# Patient Record
Sex: Male | Born: 1950 | Race: Black or African American | Hispanic: No | Smoking: Current every day smoker
Health system: Southern US, Community
[De-identification: ages and names within clinical notes are randomized; demographics above are authoritative.]

## PROBLEM LIST (undated history)

## (undated) DIAGNOSIS — Z720 Tobacco use: Secondary | ICD-10-CM

## (undated) DIAGNOSIS — F1010 Alcohol abuse, uncomplicated: Secondary | ICD-10-CM

---

## 2017-05-11 ENCOUNTER — Encounter: Payer: Self-pay | Admitting: Emergency Medicine

## 2017-05-11 ENCOUNTER — Inpatient Hospital Stay
Admission: EM | Admit: 2017-05-11 | Discharge: 2017-06-06 | DRG: 208 | Disposition: E | Payer: Medicare Other | Attending: Internal Medicine | Admitting: Internal Medicine

## 2017-05-11 ENCOUNTER — Other Ambulatory Visit: Payer: Self-pay

## 2017-05-11 ENCOUNTER — Emergency Department: Payer: Medicare Other

## 2017-05-11 DIAGNOSIS — I2489 Other forms of acute ischemic heart disease: Secondary | ICD-10-CM

## 2017-05-11 DIAGNOSIS — I248 Other forms of acute ischemic heart disease: Secondary | ICD-10-CM

## 2017-05-11 DIAGNOSIS — Z515 Encounter for palliative care: Secondary | ICD-10-CM | POA: Diagnosis not present

## 2017-05-11 DIAGNOSIS — F17290 Nicotine dependence, other tobacco product, uncomplicated: Secondary | ICD-10-CM | POA: Diagnosis present

## 2017-05-11 DIAGNOSIS — J9600 Acute respiratory failure, unspecified whether with hypoxia or hypercapnia: Secondary | ICD-10-CM

## 2017-05-11 DIAGNOSIS — R579 Shock, unspecified: Secondary | ICD-10-CM | POA: Diagnosis not present

## 2017-05-11 DIAGNOSIS — F1010 Alcohol abuse, uncomplicated: Secondary | ICD-10-CM | POA: Diagnosis present

## 2017-05-11 DIAGNOSIS — K7031 Alcoholic cirrhosis of liver with ascites: Secondary | ICD-10-CM | POA: Diagnosis not present

## 2017-05-11 DIAGNOSIS — R34 Anuria and oliguria: Secondary | ICD-10-CM | POA: Diagnosis not present

## 2017-05-11 DIAGNOSIS — R188 Other ascites: Secondary | ICD-10-CM | POA: Diagnosis present

## 2017-05-11 DIAGNOSIS — I4892 Unspecified atrial flutter: Secondary | ICD-10-CM

## 2017-05-11 DIAGNOSIS — K7290 Hepatic failure, unspecified without coma: Secondary | ICD-10-CM | POA: Diagnosis present

## 2017-05-11 DIAGNOSIS — R570 Cardiogenic shock: Secondary | ICD-10-CM | POA: Diagnosis not present

## 2017-05-11 DIAGNOSIS — I469 Cardiac arrest, cause unspecified: Secondary | ICD-10-CM | POA: Diagnosis not present

## 2017-05-11 DIAGNOSIS — R001 Bradycardia, unspecified: Secondary | ICD-10-CM | POA: Diagnosis not present

## 2017-05-11 DIAGNOSIS — K7469 Other cirrhosis of liver: Secondary | ICD-10-CM | POA: Diagnosis not present

## 2017-05-11 DIAGNOSIS — I5021 Acute systolic (congestive) heart failure: Secondary | ICD-10-CM | POA: Diagnosis present

## 2017-05-11 DIAGNOSIS — D6959 Other secondary thrombocytopenia: Secondary | ICD-10-CM | POA: Diagnosis present

## 2017-05-11 DIAGNOSIS — Z880 Allergy status to penicillin: Secondary | ICD-10-CM | POA: Diagnosis not present

## 2017-05-11 DIAGNOSIS — E871 Hypo-osmolality and hyponatremia: Secondary | ICD-10-CM | POA: Diagnosis present

## 2017-05-11 DIAGNOSIS — I426 Alcoholic cardiomyopathy: Secondary | ICD-10-CM | POA: Diagnosis present

## 2017-05-11 DIAGNOSIS — Z0189 Encounter for other specified special examinations: Secondary | ICD-10-CM

## 2017-05-11 DIAGNOSIS — K7460 Unspecified cirrhosis of liver: Secondary | ICD-10-CM | POA: Diagnosis present

## 2017-05-11 DIAGNOSIS — I4891 Unspecified atrial fibrillation: Secondary | ICD-10-CM | POA: Diagnosis present

## 2017-05-11 DIAGNOSIS — R0602 Shortness of breath: Secondary | ICD-10-CM | POA: Diagnosis present

## 2017-05-11 DIAGNOSIS — Z66 Do not resuscitate: Secondary | ICD-10-CM | POA: Diagnosis not present

## 2017-05-11 DIAGNOSIS — I2510 Atherosclerotic heart disease of native coronary artery without angina pectoris: Secondary | ICD-10-CM | POA: Diagnosis present

## 2017-05-11 DIAGNOSIS — Z978 Presence of other specified devices: Secondary | ICD-10-CM

## 2017-05-11 DIAGNOSIS — N179 Acute kidney failure, unspecified: Secondary | ICD-10-CM | POA: Diagnosis present

## 2017-05-11 DIAGNOSIS — Z01818 Encounter for other preprocedural examination: Secondary | ICD-10-CM

## 2017-05-11 DIAGNOSIS — D696 Thrombocytopenia, unspecified: Secondary | ICD-10-CM | POA: Diagnosis not present

## 2017-05-11 DIAGNOSIS — J9690 Respiratory failure, unspecified, unspecified whether with hypoxia or hypercapnia: Secondary | ICD-10-CM

## 2017-05-11 DIAGNOSIS — I2699 Other pulmonary embolism without acute cor pulmonale: Principal | ICD-10-CM

## 2017-05-11 DIAGNOSIS — Z452 Encounter for adjustment and management of vascular access device: Secondary | ICD-10-CM

## 2017-05-11 DIAGNOSIS — K769 Liver disease, unspecified: Secondary | ICD-10-CM | POA: Diagnosis not present

## 2017-05-11 DIAGNOSIS — J9601 Acute respiratory failure with hypoxia: Secondary | ICD-10-CM | POA: Diagnosis present

## 2017-05-11 DIAGNOSIS — I420 Dilated cardiomyopathy: Secondary | ICD-10-CM | POA: Diagnosis not present

## 2017-05-11 HISTORY — DX: Alcohol abuse, uncomplicated: F10.10

## 2017-05-11 HISTORY — DX: Tobacco use: Z72.0

## 2017-05-11 LAB — CBC
HCT: 47.9 % (ref 40.0–52.0)
Hemoglobin: 15.9 g/dL (ref 13.0–18.0)
MCH: 32 pg (ref 26.0–34.0)
MCHC: 33.1 g/dL (ref 32.0–36.0)
MCV: 96.6 fL (ref 80.0–100.0)
PLATELETS: 99 10*3/uL — AB (ref 150–440)
RBC: 4.96 MIL/uL (ref 4.40–5.90)
RDW: 13.9 % (ref 11.5–14.5)
WBC: 7.9 10*3/uL (ref 3.8–10.6)

## 2017-05-11 LAB — HEPATIC FUNCTION PANEL
ALBUMIN: 3.8 g/dL (ref 3.5–5.0)
ALT: 352 U/L — AB (ref 17–63)
AST: 451 U/L — AB (ref 15–41)
Alkaline Phosphatase: 64 U/L (ref 38–126)
BILIRUBIN DIRECT: 1.5 mg/dL — AB (ref 0.1–0.5)
BILIRUBIN TOTAL: 3.9 mg/dL — AB (ref 0.3–1.2)
Indirect Bilirubin: 2.4 mg/dL — ABNORMAL HIGH (ref 0.3–0.9)
Total Protein: 7.9 g/dL (ref 6.5–8.1)

## 2017-05-11 LAB — BASIC METABOLIC PANEL
Anion gap: 12 (ref 5–15)
BUN: 35 mg/dL — AB (ref 6–20)
CALCIUM: 9.1 mg/dL (ref 8.9–10.3)
CO2: 21 mmol/L — ABNORMAL LOW (ref 22–32)
Chloride: 99 mmol/L — ABNORMAL LOW (ref 101–111)
Creatinine, Ser: 1.56 mg/dL — ABNORMAL HIGH (ref 0.61–1.24)
GFR calc Af Amer: 51 mL/min — ABNORMAL LOW (ref >60)
GFR, EST NON AFRICAN AMERICAN: 44 mL/min — AB (ref >60)
GLUCOSE: 180 mg/dL — AB (ref 65–99)
Potassium: 4.4 mmol/L (ref 3.5–5.1)
Sodium: 132 mmol/L — ABNORMAL LOW (ref 135–145)

## 2017-05-11 LAB — BRAIN NATRIURETIC PEPTIDE: B Natriuretic Peptide: 1162 pg/mL — ABNORMAL HIGH (ref 0.0–100.0)

## 2017-05-11 LAB — MAGNESIUM: MAGNESIUM: 2 mg/dL (ref 1.7–2.4)

## 2017-05-11 LAB — TROPONIN I
TROPONIN I: 0.05 ng/mL — AB (ref <0.03)
TROPONIN I: 0.06 ng/mL — AB (ref <0.03)

## 2017-05-11 LAB — APTT: APTT: 37 s — AB (ref 24–36)

## 2017-05-11 MED ORDER — ASPIRIN EC 81 MG PO TBEC
81.0000 mg | DELAYED_RELEASE_TABLET | Freq: Every day | ORAL | Status: DC
  Administered 2017-05-12 – 2017-05-13 (×2): 81 mg via ORAL
  Filled 2017-05-11 (×2): qty 1

## 2017-05-11 MED ORDER — ENOXAPARIN SODIUM 100 MG/ML ~~LOC~~ SOLN
90.0000 mg | Freq: Two times a day (BID) | SUBCUTANEOUS | Status: DC
Start: 2017-05-12 — End: 2017-05-12
  Administered 2017-05-12: 90 mg via SUBCUTANEOUS
  Filled 2017-05-11 (×2): qty 1

## 2017-05-11 MED ORDER — LISINOPRIL 5 MG PO TABS: 2.5000 mg | ORAL_TABLET | Freq: Every day | ORAL | Status: DC

## 2017-05-11 MED ORDER — THIAMINE HCL 100 MG/ML IJ SOLN
100.0000 mg | Freq: Every day | INTRAMUSCULAR | Status: DC
  Administered 2017-05-14: 100 mg via INTRAVENOUS
  Filled 2017-05-11: qty 2

## 2017-05-11 MED ORDER — OXYCODONE HCL 5 MG PO TABS: 5.0000 mg | ORAL_TABLET | ORAL | Status: DC | PRN

## 2017-05-11 MED ORDER — DILTIAZEM HCL 25 MG/5ML IV SOLN
15.0000 mg | Freq: Once | INTRAVENOUS | Status: AC
  Administered 2017-05-11: 15 mg via INTRAVENOUS
  Filled 2017-05-11: qty 5

## 2017-05-11 MED ORDER — BISACODYL 5 MG PO TBEC: 5.0000 mg | DELAYED_RELEASE_TABLET | Freq: Every day | ORAL | Status: DC | PRN

## 2017-05-11 MED ORDER — FOLIC ACID 1 MG PO TABS
1.0000 mg | ORAL_TABLET | Freq: Every day | ORAL | Status: DC
  Administered 2017-05-12 – 2017-05-13 (×2): 1 mg via ORAL
  Filled 2017-05-11 (×2): qty 1

## 2017-05-11 MED ORDER — ONDANSETRON HCL 4 MG/2ML IJ SOLN: 4.0000 mg | Freq: Four times a day (QID) | INTRAMUSCULAR | Status: DC | PRN

## 2017-05-11 MED ORDER — SENNOSIDES-DOCUSATE SODIUM 8.6-50 MG PO TABS: 1.0000 | ORAL_TABLET | Freq: Every evening | ORAL | Status: DC | PRN

## 2017-05-11 MED ORDER — ONDANSETRON HCL 4 MG PO TABS: 4.0000 mg | ORAL_TABLET | Freq: Four times a day (QID) | ORAL | Status: DC | PRN

## 2017-05-11 MED ORDER — DILTIAZEM HCL 30 MG PO TABS
60.0000 mg | ORAL_TABLET | Freq: Three times a day (TID) | ORAL | Status: DC
  Administered 2017-05-11 – 2017-05-13 (×3): 60 mg via ORAL
  Filled 2017-05-11 (×5): qty 2

## 2017-05-11 MED ORDER — SODIUM CHLORIDE 0.9 % IV BOLUS (SEPSIS)
1000.0000 mL | Freq: Once | INTRAVENOUS | Status: AC
  Administered 2017-05-11: 1000 mL via INTRAVENOUS

## 2017-05-11 MED ORDER — ADULT MULTIVITAMIN W/MINERALS CH
1.0000 | ORAL_TABLET | Freq: Every day | ORAL | Status: DC
  Administered 2017-05-12 – 2017-05-13 (×2): 1 via ORAL
  Filled 2017-05-11 (×2): qty 1

## 2017-05-11 MED ORDER — FUROSEMIDE 10 MG/ML IJ SOLN
20.0000 mg | Freq: Two times a day (BID) | INTRAMUSCULAR | Status: DC
  Administered 2017-05-11 – 2017-05-12 (×2): 20 mg via INTRAVENOUS
  Filled 2017-05-11 (×3): qty 2

## 2017-05-11 MED ORDER — ACETAMINOPHEN 325 MG PO TABS: 650.0000 mg | ORAL_TABLET | Freq: Four times a day (QID) | ORAL | Status: DC | PRN

## 2017-05-11 MED ORDER — VITAMIN B-1 100 MG PO TABS
100.0000 mg | ORAL_TABLET | Freq: Every day | ORAL | Status: DC
  Administered 2017-05-12 – 2017-05-13 (×2): 100 mg via ORAL
  Filled 2017-05-11 (×2): qty 1

## 2017-05-11 MED ORDER — DILTIAZEM HCL 25 MG/5ML IV SOLN
10.0000 mg | INTRAVENOUS | Status: AC
  Administered 2017-05-11: 10 mg via INTRAVENOUS
  Filled 2017-05-11: qty 5

## 2017-05-11 MED ORDER — SODIUM CHLORIDE 0.9% FLUSH
3.0000 mL | Freq: Two times a day (BID) | INTRAVENOUS | Status: DC
  Administered 2017-05-11 – 2017-05-14 (×6): 3 mL via INTRAVENOUS

## 2017-05-11 MED ORDER — SODIUM CHLORIDE 0.9 % IV SOLN: 250.0000 mL | INTRAVENOUS | Status: DC | PRN

## 2017-05-11 MED ORDER — LORAZEPAM 1 MG PO TABS: 1.0000 mg | ORAL_TABLET | Freq: Four times a day (QID) | ORAL | Status: DC | PRN

## 2017-05-11 MED ORDER — ALBUTEROL SULFATE (2.5 MG/3ML) 0.083% IN NEBU: 2.5000 mg | INHALATION_SOLUTION | RESPIRATORY_TRACT | Status: DC | PRN

## 2017-05-11 MED ORDER — ENOXAPARIN SODIUM 100 MG/ML ~~LOC~~ SOLN
1.0000 mg/kg | Freq: Once | SUBCUTANEOUS | Status: AC
  Administered 2017-05-11: 90 mg via SUBCUTANEOUS
  Filled 2017-05-11: qty 1

## 2017-05-11 MED ORDER — IOPAMIDOL (ISOVUE-370) INJECTION 76%
75.0000 mL | Freq: Once | INTRAVENOUS | Status: AC | PRN
  Administered 2017-05-11: 75 mL via INTRAVENOUS

## 2017-05-11 MED ORDER — ACETAMINOPHEN 650 MG RE SUPP: 650.0000 mg | Freq: Four times a day (QID) | RECTAL | Status: DC | PRN

## 2017-05-11 MED ORDER — LISINOPRIL 5 MG PO TABS
2.5000 mg | ORAL_TABLET | Freq: Every day | ORAL | Status: DC
  Filled 2017-05-11: qty 1

## 2017-05-11 MED ORDER — SODIUM CHLORIDE 0.9% FLUSH: 3.0000 mL | INTRAVENOUS | Status: DC | PRN

## 2017-05-11 MED ORDER — CARVEDILOL 3.125 MG PO TABS
3.1250 mg | ORAL_TABLET | Freq: Two times a day (BID) | ORAL | Status: DC
  Administered 2017-05-12 – 2017-05-13 (×2): 3.125 mg via ORAL
  Filled 2017-05-11 (×2): qty 1

## 2017-05-11 MED ORDER — DILTIAZEM HCL 100 MG IV SOLR: 15.0000 mg | Freq: Once | INTRAVENOUS | Status: DC

## 2017-05-11 MED ORDER — LORAZEPAM 2 MG/ML IJ SOLN: 1.0000 mg | Freq: Four times a day (QID) | INTRAMUSCULAR | Status: DC | PRN

## 2017-05-11 NOTE — ED Notes (Signed)
Patient transported to X-ray 

## 2017-05-11 NOTE — ED Triage Notes (Signed)
Says short of breath with any exertion for about 3 weeks.  [facility]ent to [facility] week ago.

## 2017-05-11 NOTE — Progress Notes (Signed)
ANTICOAGULATION CONSULT NOTE - Initial Consult  Pharmacy Consult for enoxaparin Indication: pulmonary embolus  Allergies  Allergen Reactions  . Penicillins Rash    Has patient had a PCN reaction causing immediate rash, facial/tongue/throat swelling, SOB or lightheadedness with hypotension: Yes Has patient had a PCN reaction causing severe rash involving mucus membranes or skin necrosis: No Has patient had a PCN reaction that required hospitalization: No Has patient had a PCN reaction occurring within the last 10 years: No If all of the above answers are "NO", then may proceed with Cephalosporin use.     Patient Measurements: Height: 5\' 5"  (165.1 cm) Weight: 194 lb (88 kg) IBW/kg (Calculated) : 61.5 Heparin Dosing Weight: 88 kg  Vital Signs: Temp: 97.5 F (36.4 C) (11/05 2213) Temp Source: Oral (11/05 2213) BP: 165/73 (11/05 2213) Pulse Rate: 128 (11/05 2213)  Labs: Recent Labs    06/09/17 1211  HGB 15.9  HCT 47.9  PLT 99*  CREATININE 1.56*  TROPONINI 0.05*    Estimated Creatinine Clearance: 46.9 mL/min (A) (by C-G formula based on SCr of 1.56 mg/dL (H)).   Medical History: History reviewed. No pertinent past medical history.  Medications:  Scheduled:  . aspirin EC  81 mg Oral Daily  . [START ON 05/12/2017] carvedilol  3.125 mg Oral BID WC  . diltiazem  10 mg Intravenous STAT  . diltiazem  60 mg Oral Q8H  . [START ON 05/12/2017] enoxaparin (LOVENOX) injection  90 mg Subcutaneous Q12H  . folic acid  1 mg Oral Daily  . furosemide  20 mg Intravenous Q12H  . lisinopril  2.5 mg Oral Daily  . multivitamin with minerals  1 tablet Oral Daily  . sodium chloride flush  3 mL Intravenous Q12H  . thiamine  100 mg Oral Daily   Or  . thiamine  100 mg Intravenous Daily    Assessment: Patient admitted for SOB found to have RLL PE on CT. Patient is not on any anticoagulation PTA. Being started on full dose lovenox.  Goal of Therapy:  Anti-Xa level 0.6-1 units/ml 4hrs  after LMWH dose given Monitor platelets by anticoagulation protocol: Yes   Plan:  Patient received initial dose of enoxaparin 90 mg subq x 1 @ 1500. Will start enoxaparin 1 mg/kg q12h (90 mg subq q12h). Baseline labs ordered as well as VTE order set. CBC daily for 5 days then weekly. BMP daily for 5 days to monitor Scr for enoxaparin therapy. Will continue on enoxaparin until decision to switch to DOAC.  Thomasene Rippleavid Wilder Amodei, PharmD, BCPS Clinical Pharmacist 05/23/2017

## 2017-05-11 NOTE — H&P (Signed)
[facility] [place] [facility]   PATIENT NAME: Ryan Banks    MR#:  [id number]  DATE OF BIRTH:  05/21/1951  DATE OF ADMISSION:  05/15/2017  PRIMARY CARE PHYSICIAN: [facility]   REQUESTING/REFERRING PHYSICIAN: Nita SickleVeronese, [place], MD  CHIEF COMPLAINT:   Chief Complaint  Patient presents with  . Shortness of Breath   Shortness of breath 3-4 weeks HISTORY OF PRESENT ILLNESS:  Ryan Banks  is a 66 y.o. male with no past medical history.  He has not seen any doctor for 50 years.  He came to ED due to worsening shortness of breath for the past 3-4 weeks.  He denies any fever or chills, but has occasional cough.  The patient smokes cigar for 5 years.  He also has been drinking beer daily for several years.  He is found A. fib with RVR at 130s in the ED.  The patient denies any long distance travel history.  CXR: Mild cardiomegaly and pulmonary vascular congestion consistent with low-grade CHF.  CT angiogram of the chest showed right-sided PE. Medium-sized right pleural effusion with associated atelectasis. Hepatic cirrhosis with ascites and CAD.  PAST MEDICAL HISTORY:  History reviewed. No pertinent past medical history.  PAST SURGICAL HISTORY:  History reviewed. No pertinent surgical history.  SOCIAL HISTORY:   Social History   Tobacco Use  . Smoking status: Current Every Day Smoker    Types: Cigars  . Smokeless tobacco: Never Used  Substance Use Topics  . Alcohol use: Yes    FAMILY HISTORY:  No family history on file.  Both parents died.  No medical history as per patient.  DRUG ALLERGIES:   Allergies  Allergen Reactions  . Penicillins Rash    Has patient had a PCN reaction causing immediate rash, facial/tongue/throat swelling, SOB or lightheadedness with hypotension: Yes Has patient had a PCN reaction causing severe rash involving mucus membranes or skin necrosis: No Has patient had a PCN reaction that required  hospitalization: No Has patient had a PCN reaction occurring within the last 10 years: No If all of the above answers are "NO", then may proceed with Cephalosporin use.     REVIEW OF SYSTEMS:   Review of Systems  Constitutional: Positive for malaise/fatigue. Negative for chills and fever.  HENT: Negative for sore throat.   Eyes: Negative for blurred vision and double vision.  Respiratory: Positive for cough and shortness of breath. Negative for hemoptysis, sputum production, wheezing and stridor.   Cardiovascular: Negative for chest pain, palpitations, orthopnea and leg swelling.  Gastrointestinal: Negative for abdominal pain, blood in stool, diarrhea, melena, nausea and vomiting.  Genitourinary: Negative for dysuria, flank pain and hematuria.  Musculoskeletal: Negative for back pain and joint pain.  Skin: Negative for rash.  Neurological: Positive for weakness. Negative for dizziness, sensory change, focal weakness, seizures, loss of consciousness and headaches.  Endo/Heme/Allergies: Negative for polydipsia.  Psychiatric/Behavioral: Negative for depression. The patient is not nervous/anxious.     MEDICATIONS AT HOME:   Prior to Admission medications   Not on File      VITAL SIGNS:  Blood pressure (!) 132/91, pulse (!) 131, temperature (!) 97.5 F (36.4 C), temperature source Oral, resp. rate (!) 60, height 5\' 5"  (1.651 m), weight 194 lb (88 kg), SpO2 99 %.  PHYSICAL EXAMINATION:  Physical Exam  GENERAL:  66 y.o.-year-old patient lying in the bed with no acute distress.  EYES: Pupils equal, round, reactive to light and accommodation. No scleral  icterus. Extraocular muscles intact.  HEENT: Head atraumatic, normocephalic. Oropharynx and nasopharynx clear.  NECK:  Supple, no jugular venous distention. No thyroid enlargement, no tenderness.  LUNGS: Coarse breath sounds bilaterally, no wheezing, rales,rhonchi or crepitation. No use of accessory muscles of respiration.    CARDIOVASCULAR: Irregular rate and rhythm, tachycardia,  no murmurs, rubs, or gallops.  ABDOMEN: Soft, nontender, nondistended. Bowel sounds present. No organomegaly or mass.  EXTREMITIES: Trace edema, no cyanosis, or clubbing.  No calf tenderness. NEUROLOGIC: Cranial nerves II through XII are intact. Muscle strength 5/5 in all extremities. Sensation intact. Gait not checked.  PSYCHIATRIC: The patient is alert and oriented x 3.  SKIN: No obvious rash, lesion, or ulcer.   LABORATORY PANEL:   CBC Recent Labs  Lab 2017/05/25 1211  WBC 7.9  HGB 15.9  HCT 47.9  PLT 99*   ------------------------------------------------------------------------------------------------------------------  Chemistries  Recent Labs  Lab 05/25/17 1211  NA 132*  K 4.4  CL 99*  CO2 21*  GLUCOSE 180*  BUN 35*  CREATININE 1.56*  CALCIUM 9.1  AST 451*  ALT 352*  ALKPHOS 64  BILITOT 3.9*   ------------------------------------------------------------------------------------------------------------------  Cardiac Enzymes Recent Labs  Lab May 25, 2017 1211  TROPONINI 0.05*   ------------------------------------------------------------------------------------------------------------------  RADIOLOGY:  Dg Chest 2 View  Result Date: May 25, 2017 CLINICAL DATA:  Three weeks of shortness of breath with productive cough. Current smoker. EXAM: CHEST  2 VIEW COMPARISON:  None in PACs FINDINGS: The left lung is well-expanded. On the right there is mild volume loss at the base with small effusion and probable infiltrate in the lower lobe. The cardiac silhouette is enlarged. The pulmonary vascularity is engorged centrally with mild cephalization. There is calcification in the wall of the aortic arch. IMPRESSION: Right lower lobe atelectasis or pneumonia with small right pleural effusion. Followup PA and lateral chest X-ray is recommended in 3-4 weeks following trial of antibiotic therapy to ensure resolution and exclude  underlying malignancy. Mild cardiomegaly and pulmonary vascular congestion consistent with low-grade CHF. Thoracic aortic atherosclerosis. Electronically Signed   By: David  [place] M.D.   On: 05/25/2017 12:39   Ct Angio Chest Pe W And/or Wo Contrast  Result Date: 05/25/17 CLINICAL DATA:  Shortness of breath with exertion for 3 weeks. Symptoms are worse while lying flat. EXAM: CT ANGIOGRAPHY CHEST WITH CONTRAST TECHNIQUE: Multidetector CT imaging of the chest was performed using the standard protocol during bolus administration of intravenous contrast. Multiplanar CT image reconstructions and MIPs were obtained to evaluate the vascular anatomy. CONTRAST:  75 mL Isovue 370 COMPARISON:  Chest radiograph May 25, 2017 FINDINGS: Cardiovascular: Contrast injection is sufficient to demonstrate satisfactory opacification of the pulmonary arteries to the segmental level. There is a filling defect within the right lower lobe pulmonary artery distribution, extending into the posterior and lateral basal segmental arteries. The main pulmonary artery is within normal limits for size. There is no CT evidence of acute right heart strain. There is calcific aortic atherosclerosis and multifocal coronary artery calcification. There is a normal 3-vessel arch branching pattern. Heart size is mildly enlarged, without pericardial effusion. Mediastinum/Nodes: No mediastinal, hilar or axillary lymphadenopathy. The visualized thyroid and thoracic esophageal course are unremarkable. Lungs/Pleura: There is a medium-sized right pleural effusion with associated atelectasis. The airways are patent. No pulmonary nodules or masses. No pneumothorax. Upper Abdomen: Contrast bolus timing is not optimized for evaluation of the abdominal organs. Small volume right upper quadrant ascites. There is relative hypertrophy of the left hepatic lobe with a mildly nodular liver contour. There  is also a small amount of fluid in the left upper quadrant. The  spleen is small. Normal pancreas. Visualized portions of the kidneys are normal. Normal adrenal glands. Musculoskeletal: No chest wall abnormality. No acute or significant osseous findings. Review of the MIP images confirms the above findings. IMPRESSION: 1. Right lower lobe pulmonary embolus involving the lateral and posterior basal segmental arteries. No right heart strain by CT. 2. Medium-sized right pleural effusion with associated atelectasis. 3. A small volume of right and left upper quadrant abdominal ascites with relative left hepatic lobe hypertrophy and mildly nodular hepatic contour that may indicate underlying hepatic cirrhosis. Nonemergent liver ultrasound or abdominal MRI might be helpful for further characterization. 4. 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 Critical Value/emergent results were called by telephone at the time of interpretation on 06/05/2017 at 1:43 pm to Dr. Nita Sickle , who verbally acknowledged these results. Electronically Signed   By: Deatra Robinson M.D.   On: 05/13/2017 13:48      IMPRESSION AND PLAN:   Acute pulmonary embolism The patient will be admitted to the medical floor with telemetry monitor. He is given Lovenox therapeutic dose in the ED.  Continue Lovenox pharmacy to dose.  New onset A. fib with RVR. The patient was given Cardizem IV 1 dose in ED. I will start Cardizem p.o. and continue Lovenox pharmacy to dose. IV Cardizem as needed.  If heart rate is not controlled, may start Cardizem drip. Echocardiogram and cardiology consult.  Acute CHF, unclear type. Start on Lasix, follow-up echocardiogram and cardiology consult.  Elevated troponin.  Possible due to above.  Follow-up troponin level, continue Lovenox.  CAD.  Check lipid panel, start aspirin, follow-up cardiology consult.  Pleural effusion. Start Lasix IV.  New diagnosis of liver cirrhosis with ascites. Abnormal liver function test. Check hepatitis panel,  abdominal ultrasound and GI consult. Start Lasix.  Hyponatremia and acute renal failure.  Follow-up BMP while on Lasix.  Alcohol abuse.  Start CIWA protocol. Tobacco abuse.  Smoking cessation was counseled for 3-4 minutes.   All the records are reviewed and case discussed with ED provider. Management plans discussed with the patient, his sister and they are in agreement.  CODE STATUS: Full code  TOTAL TIME TAKING CARE OF THIS PATIENT: 60 minutes.    Shaune Pollack M.D on 05/31/2017 at 2:51 PM  Between 7am to 6pm - Pager - [phone]  After 6pm go to [email or web address] - Scientist, research (life sciences) [facility]  Office  [phone]  CC: Primary care physician; [facility]   Note: This dictation was prepared with Dragon dictation along with smaller phrase technology. Any transcriptional errors that result from this process are unin

## 2017-05-11 NOTE — ED Provider Notes (Addendum)
[facility] Emergency Department Provider Note  ____________________________________________  Time seen: Approximately 12:24 PM  I have reviewed the triage vital signs and the nursing notes.   HISTORY  Chief Complaint Shortness of Breath   HPI Ryan Banks is a 66 y.o. male no significant past medical history however who has not seen a physician in several decades who presents for evaluation of shortness of breath. Patient reports that his symptoms have been ongoing for 3 weeks and getting progressively worse. Shortness of breath is constant and worse with minimal exertion or laying flat. He has noticed swelling of his lower extremities which is new for him. He smokes cigars. He denies any personal or family history of blood clots or heart disease. He denies leg pain or swelling, hemoptysis, recent travel immobilization. Patient denies chest pain, abdominal pain, nausea or vomiting, fever or URI symptoms.  History reviewed. No pertinent past medical history.  There are no active problems to display for this patient.   History reviewed. No pertinent surgical history.  Prior to Admission medications   Not on File    Allergies Penicillins  No family history on file.  Social History Social History   Tobacco Use  . Smoking status: Current Every Day Smoker    Types: Cigars  . Smokeless tobacco: Never Used  Substance Use Topics  . Alcohol use: Yes  . Drug use: Not on file    Review of Systems  Constitutional: Negative for fever. Eyes: Negative for visual changes. ENT: Negative for sore throat. Neck: No neck pain  Cardiovascular: Negative for chest pain. Respiratory: + shortness of breath. Gastrointestinal: Negative for abdominal pain, vomiting or diarrhea. Genitourinary: Negative for dysuria. Musculoskeletal: Negative for back pain. Skin: Negative for rash. Neurological: Negative for headaches, weakness or numbness. Psych: No SI or  HI  ____________________________________________   PHYSICAL EXAM:  VITAL SIGNS: ED Triage Vitals  Enc Vitals Group     BP 05-18-17 1143 (!) 132/91     Pulse Rate 18-May-2017 1143 (!) 130     Resp 05/18/17 1143 18     Temp 05-18-2017 1143 (!) 97.5 F (36.4 C)     Temp Source 2017/05/18 1143 Oral     SpO2 05-18-2017 1143 100 %     Weight --      Height 05/18/2017 1144 5\' 5"  (1.651 m)     Head Circumference --      Peak Flow --      Pain Score 2017/05/18 1143 0     Pain Loc --      Pain Edu? --      Excl. in GC? --     Constitutional: Alert and oriented. Well appearing and in no apparent distress. HEENT:      Head: Normocephalic and atraumatic.         Eyes: Conjunctivae are normal. Sclera is non-icteric.       Mouth/Throat: Mucous membranes are moist.       Neck: Supple with no signs of meningismus. Cardiovascular: Tachycardic with regular rhythm. No murmurs, gallops, or rubs. 2+ symmetrical distal pulses are present in all extremities. No JVD. Respiratory: Normal respiratory effort. Lungs are clear to auscultation bilaterally. No wheezes, crackles, or rhonchi.  Gastrointestinal: Soft, non tender, and non distended with positive bowel sounds. No rebound or guarding. Musculoskeletal: Trace pitting edema bilaterally Neurologic: Normal speech and language. Face is symmetric. Moving all extremities. No gross focal neurologic deficits are appreciated. Skin: Skin is warm, dry and intact.  No rash noted. Psychiatric: Mood and affect are normal. Speech and behavior are normal.  ____________________________________________   LABS (all labs ordered are listed, but only abnormal results are displayed)  Labs Reviewed  BASIC METABOLIC PANEL - Abnormal; Notable for the following components:      Result Value   Sodium 132 (*)    Chloride 99 (*)    CO2 21 (*)    Glucose, Bld 180 (*)    BUN 35 (*)    Creatinine, Ser 1.56 (*)    GFR calc non Af Amer 44 (*)    GFR calc Af Amer 51 (*)    All  other components within normal limits  CBC - Abnormal; Notable for the following components:   Platelets 99 (*)    All other components within normal limits  TROPONIN I - Abnormal; Notable for the following components:   Troponin I 0.05 (*)    All other components within normal limits  BRAIN NATRIURETIC PEPTIDE  HEPATIC FUNCTION PANEL   ____________________________________________  EKG  ED ECG REPORT I, Nita Sicklearolina Shakeyla Giebler, the attending physician, personally viewed and interpreted this ECG.  Atrial flutter, rate of 132, normal QRS and QTc intervals, normal axis, no ST elevations or depressions. No prior for comparison.  ____________________________________________  RADIOLOGY  CXR:  Right lower lobe atelectasis or pneumonia with small right pleural effusion. Followup PA and lateral chest X-ray is recommended in 3-4 weeks following trial of antibiotic therapy to ensure resolution and exclude underlying malignancy. Mild cardiomegaly and pulmonary vascular congestion consistent with low-grade CHF. Thoracic aortic atherosclerosis.   CTA chest: 1. Right lower lobe pulmonary embolus involving the lateral and posterior basal segmental arteries. No right heart strain by CT. 2. Medium-sized right pleural effusion with associated atelectasis. 3. A small volume of right and left upper quadrant abdominal ascites with relative left hepatic lobe hypertrophy and mildly nodular hepatic contour that may indicate underlying hepatic cirrhosis. Nonemergent liver ultrasound or abdominal MRI might be helpful for further characterization. 4. 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 ____________________________________________   PROCEDURES  Procedure(s) performed: None Procedures Critical Care performed: yes   CRITICAL CARE Performed by: Nita Sicklearolina Harlei Lehrmann  ?  Total critical care time: 40 min  Critical care time was exclusive of separately billable procedures and  treating other patients.  Critical care was necessary to treat or prevent imminent or life-threatening deterioration.  Critical care was time spent personally by me on the following activities: development of treatment plan with patient and/or surrogate as well as nursing, discussions with consultants, evaluation of patient's response to treatment, examination of patient, obtaining history from patient or surrogate, ordering and performing treatments and interventions, ordering and review of laboratory studies, ordering and review of radiographic studies, pulse oximetry and re-evaluation of patient's condition.  ____________________________________________   INITIAL IMPRESSION / ASSESSMENT AND PLAN / ED COURSE   66 y.o. male no significant past medical history however who has not seen a physician in several decades who presents for evaluation of shortness of breath x 3 weeks. Patient found to be in a flutter with rapid response and a heart rate of 132, normotensive. Sats are 100% with clear lungs, he does have 1+ pitting edema on bilateral lower extremities. Differential diagnosis for patient's shortness of breath could be a flutter, CHF, PE, ACS. Chest x-ray concerning for low-grade CHF and questionable right lower lobe pneumonia. Patient has no cough or fever. We'll send patient for CT angiogram to rule out PE before controlling patient's rate in  case has a large PE that is preload dependent.    _________________________ 1:56 PM on 05/26/2017 -----------------------------------------  CTA showing acute R sided PE with R pleural effusion. Will give IVF to increased preload and if persistent RVR will give cardizem. Patient also with ascites and findings consistent with cirrhosis of the liver which is also a new diagnosis for patient. Labs showing AKI and thrombocytopenia. Troponin elevated at 0.05 from demand ischemia. Will admit to Hospitalist   As part of my medical decision making, I reviewed  the following data within the electronic MEDICAL RECORD NUMBER Nursing notes reviewed and incorporated, Labs reviewed , EKG interpreted , Radiograph reviewed , Discussed with admitting physician , Notes from prior ED visits and [place] Controlled Substance Database    Pertinent labs & imaging results that were available during my care of the patient were reviewed by me and considered in my medical decision making (see chart for details).    ____________________________________________   FINAL CLINICAL IMPRESSION(S) / ED DIAGNOSES  Final diagnoses:  Pulmonary embolus, right (HCC)  AKI (acute kidney injury) (HCC)  Atrial flutter with rapid ventricular response (HCC)  Thrombocytopenia (HCC)  Demand ischemia of myocardium (HCC)      NEW MEDICATIONS STARTED DURING THIS VISIT:  This SmartLink is deprecated. Use AVSMEDLIST instead to display the medication list for a patient.   Note:  This document was prepared using Dragon voice recognition software and may include unintentional dictation errors.    Nita Sickle, MD May 26, 2017 1358    Nita Sickle, MD 05/26/2017 [phone]

## 2017-05-11 NOTE — ED Notes (Signed)
Pt remains in NAD. Dr. Don PerkingVeronese notified of pt's HR. Nurse was instructed to go ahead and give Cardizem bolus.

## 2017-05-11 NOTE — ED Notes (Signed)
Dr. Don PerkingVeronese at pt's bedside. Pt stating that he isn't able to lay down flat without feeling pressure and SOB. Pt denying any pain or SOB at this time but he is sitting up. Pt denying any other sx. Pt in NAD

## 2017-05-11 NOTE — ED Notes (Signed)
Dr. Don PerkingVeronese stating to hold Cardizem bolus until NS bolus had completed so pt's HR could be reevaluated.

## 2017-05-11 NOTE — ED Notes (Signed)
o2 2 L / [place] sarted

## 2017-05-12 ENCOUNTER — Inpatient Hospital Stay: Payer: Medicare Other

## 2017-05-12 ENCOUNTER — Inpatient Hospital Stay
Admit: 2017-05-12 | Discharge: 2017-05-12 | Disposition: A | Discharge status: Home / Self Care | Payer: Medicare Other | Attending: Internal Medicine | Admitting: Internal Medicine

## 2017-05-12 ENCOUNTER — Other Ambulatory Visit: Payer: Self-pay

## 2017-05-12 LAB — PROTIME-INR
INR: 1.05
PROTHROMBIN TIME: 13.6 s (ref 11.4–15.2)

## 2017-05-12 LAB — FERRITIN: Ferritin: 128 ng/mL (ref 24–336)

## 2017-05-12 LAB — BASIC METABOLIC PANEL
ANION GAP: 7 (ref 5–15)
BUN: 17 mg/dL (ref 6–20)
CO2: 27 mmol/L (ref 22–32)
Calcium: 8.6 mg/dL — ABNORMAL LOW (ref 8.9–10.3)
Chloride: 105 mmol/L (ref 101–111)
Creatinine, Ser: 1.01 mg/dL (ref 0.61–1.24)
GFR calc Af Amer: >60 mL/min (ref >60)
GLUCOSE: 157 mg/dL — AB (ref 65–99)
POTASSIUM: 3.7 mmol/L (ref 3.5–5.1)
Sodium: 139 mmol/L (ref 135–145)

## 2017-05-12 LAB — ECHOCARDIOGRAM COMPLETE
Height: 65 in
WEIGHTICAEL: 2929.6 [oz_av]

## 2017-05-12 LAB — LIPID PANEL
Cholesterol: 167 mg/dL (ref 0–200)
HDL: 36 mg/dL — ABNORMAL LOW (ref >40)
LDL CALC: 93 mg/dL (ref 0–99)
TRIGLYCERIDES: 192 mg/dL — AB (ref <150)
Total CHOL/HDL Ratio: 4.6 RATIO
VLDL: 38 mg/dL (ref 0–40)

## 2017-05-12 LAB — CBC
HCT: 45.5 % (ref 40.0–52.0)
HEMOGLOBIN: 15 g/dL (ref 13.0–18.0)
MCH: 31.6 pg (ref 26.0–34.0)
MCHC: 32.9 g/dL (ref 32.0–36.0)
MCV: 96.2 fL (ref 80.0–100.0)
Platelets: 95 10*3/uL — ABNORMAL LOW (ref 150–440)
RBC: 4.73 MIL/uL (ref 4.40–5.90)
RDW: 14 % (ref 11.5–14.5)
WBC: 8.3 10*3/uL (ref 3.8–10.6)

## 2017-05-12 LAB — HEMOGLOBIN A1C
HEMOGLOBIN A1C: 6 % — AB (ref 4.8–5.6)
MEAN PLASMA GLUCOSE: 125.5 mg/dL

## 2017-05-12 MED ORDER — ENOXAPARIN SODIUM 100 MG/ML ~~LOC~~ SOLN
85.0000 mg | Freq: Two times a day (BID) | SUBCUTANEOUS | Status: DC
  Filled 2017-05-12: qty 1

## 2017-05-12 MED ORDER — APIXABAN 5 MG PO TABS: 5.0000 mg | ORAL_TABLET | Freq: Two times a day (BID) | ORAL | Status: DC

## 2017-05-12 MED ORDER — GADOBENATE DIMEGLUMINE 529 MG/ML IV SOLN
20.0000 mL | Freq: Once | INTRAVENOUS | Status: AC | PRN
  Administered 2017-05-12: 17 mL via INTRAVENOUS

## 2017-05-12 MED ORDER — APIXABAN 5 MG PO TABS
10.0000 mg | ORAL_TABLET | Freq: Two times a day (BID) | ORAL | Status: DC
  Administered 2017-05-12 – 2017-05-13 (×3): 10 mg via ORAL
  Filled 2017-05-12 (×4): qty 2

## 2017-05-12 NOTE — Progress Notes (Signed)
ANTICOAGULATION CONSULT NOTE - Initial Consult  Pharmacy Consult for Apixaban Indication: pulmonary embolus  Allergies  Allergen Reactions  . Penicillins Rash    Has patient had a PCN reaction causing immediate rash, facial/tongue/throat swelling, SOB or lightheadedness with hypotension: Yes Has patient had a PCN reaction causing severe rash involving mucus membranes or skin necrosis: No Has patient had a PCN reaction that required hospitalization: No Has patient had a PCN reaction occurring within the last 10 years: No If all of the above answers are "NO", then may proceed with Cephalosporin use.     Patient Measurements: Height: 5\' 5"  (165.1 cm) Weight: 183 lb 1.6 oz (83.1 kg) IBW/kg (Calculated) : 61.5 Heparin Dosing Weight: 88 kg  Vital Signs: Temp: 97.4 F (36.3 C) (11/06 0836) Temp Source: Oral (11/06 0836) BP: 115/87 (11/06 0836) Pulse Rate: 63 (11/06 1100)  Labs: Recent Labs    02-23-2017 1211 02-23-2017 2244 05/12/17 0445 05/12/17 0602  HGB 15.9  --   --  15.0  HCT 47.9  --   --  45.5  PLT 99*  --   --  95*  APTT  --  37*  --   --   LABPROT  --   --  13.6  --   INR  --   --  1.05  --   CREATININE 1.56*  --  1.01  --   TROPONINI 0.05* 0.06*  --   --     Estimated Creatinine Clearance: 70.4 mL/min (by C-G formula based on SCr of 1.01 mg/dL).   Medical History: History reviewed. No pertinent past medical history.  Medications:  Scheduled:  . apixaban  10 mg Oral BID   Followed by  . [START ON 05/19/2017] apixaban  5 mg Oral BID  . aspirin EC  81 mg Oral Daily  . carvedilol  3.125 mg Oral BID WC  . diltiazem  60 mg Oral Q8H  . folic acid  1 mg Oral Daily  . furosemide  20 mg Intravenous Q12H  . lisinopril  2.5 mg Oral Daily  . multivitamin with minerals  1 tablet Oral Daily  . sodium chloride flush  3 mL Intravenous Q12H  . thiamine  100 mg Oral Daily   Or  . thiamine  100 mg Intravenous Daily    Assessment: Patient admitted for SOB found to  have RLL PE on CT. Patient was not on  anticoagulation PTA and was started on full dose lovenox. Patients last dose of enoxaparin was this morning at 0346.   Plan:  Will start Apixaban 10mg  BID x 7 days followed by apixaban 5mg  BID thereafter. Enoxaparin has been discontinued. First dose of apixaban ordered 2 hours before next dose of enoxaparin was due.   Pharmacy to counsel patient prior to discharge.   Gardner CandleSheema M Ashleen Demma, PharmD, BCPS Clinical Pharmacist 05/12/2017 11:39 AM

## 2017-05-12 NOTE — Progress Notes (Signed)
Soft BP and HR, Dr. Cherlynn KaiserSainani with orders to hold the morning dose of lasix and lisinopril. Patient resting quietly in bed. York SpanielSaid he is tired from his ultrasound

## 2017-05-12 NOTE — Consult Note (Addendum)
Cephas Darby, MD [address]  [place]  [place], [place] [address]  Main: [phone]  Fax: [phone] Pager: [phone]   Consultation  Referring Provider:     No ref. provider found Primary Care Physician:  [facility] Primary Gastroenterologist: None        Reason for Consultation:     Cirrhosis  Date of Admission:  05/25/2017 Date of Consultation:  05/13/2017         HPI:   Ryan Banks is a 66 y.o. male with no PMH/PSH, chronic ETOH use, has not seen a physician in several years, presented to ER with 3weeks of exertional SOB and swelling of legs. He is diagnosed with right lower lobe PE, no DVT. He is also found to have cardiomyopathy, EF 35%, new onset of Afib, demand ischemia. He is started on eliquis and lasix. He is incidentally found to have probable cirrhosis and lesion in liver. He has significantly elevated AST/ALT, T Bili but not AP. He does have thrombocytopenia. GI consulted for management of cirrhosis. He denies any GI symptoms other than abdominal distension.   His sister is bedside  He is not on any prescription or OTC meds Unknown Hepatitis B/C status Denies blood transfusions in the past No tattoos, denies IVDA Did not serve as active [place] Denies NSAID/Tylenol use Denies fam h/o liver disease/cancer Never had an EGD or colonoscopy  NSAIDs: none  Antiplts/Anticoagulants/Anti thrombotics: eliquis for PE  GI Procedures: none  History reviewed. No pertinent past medical history.  History reviewed. No pertinent surgical history.  Prior to Admission medications   Not on File    History reviewed. No pertinent family history.   Social History   Tobacco Use  . Smoking status: Current Every Day Smoker    Types: Cigars  . Smokeless tobacco: Never Used  Substance Use Topics  . Alcohol use: Yes  . Drug use: Not on file    Allergies as of 05/16/2017 - Review Complete 05/27/2017  Allergen Reaction Noted  .  Penicillins Rash 05/10/2017    Review of Systems:    All systems reviewed and negative except where noted in HPI.   Physical Exam:  Vital signs in last 24 hours: Temp:  [97.4 F (36.3 C)] 97.4 F (36.3 C) (11/06 2222) Pulse Rate:  [44-125] 49 (11/06 2222) Resp:  [15-18] 15 (11/06 1638) BP: (103-117)/(32-87) 116/67 (11/06 2222) SpO2:  [92 %-100 %] 92 % (11/06 2222) Weight:  [183 lb 1.6 oz (83.1 kg)] 183 lb 1.6 oz (83.1 kg) (11/06 0508)   General:   Pleasant, cooperative in NAD, SOB when speaking  Head:  Normocephalic and atraumatic. Eyes:   No icterus.   Conjunctiva pink. PERRLA. Ears:  Normal auditory acuity. Neck:  Supple; no masses or thyroidomegaly Lungs: Respirations even and unlabored. Lungs clear to auscultation bilaterally.   No wheezes, crackles, or rhonchi.  Heart:  Regular rate and rhythm;  Without murmur, clicks, rubs or gallops Abdomen:  Soft, distended, tympanic, nontender. Normal bowel sounds. No appreciable masses or hepatomegaly.  No rebound or guarding.  Rectal:  Not performed. Msk:  Symmetrical without gross deformities.  Strength normal Extremities:  1+ edema, no cyanosis or clubbing. Neurologic:  Alert and oriented x3;  grossly normal neurologically. Skin:  Intact without significant lesions or rashes. Cervical Nodes:  No significant cervical adenopathy. Psych:  Alert and cooperative. Normal affect.  LAB RESULTS: CBC Latest Ref Rng & Units 05/12/2017 05/31/2017  WBC 3.8 - 10.6  K/uL 8.3 7.9  Hemoglobin 13.0 - 18.0 g/dL 15.0 15.9  Hematocrit 40.0 - 52.0 % 45.5 47.9  Platelets 150 - 440 K/uL 95(L) 99(L)    BMET BMP Latest Ref Rng & Units 05/12/2017 06/01/2017  Glucose 65 - 99 mg/dL 157(H) 180(H)  BUN 6 - 20 mg/dL 17 35(H)  Creatinine 0.61 - 1.24 mg/dL 1.01 1.56(H)  Sodium 135 - 145 mmol/L 139 132(L)  Potassium 3.5 - 5.1 mmol/L 3.7 4.4  Chloride 101 - 111 mmol/L 105 99(L)  CO2 22 - 32 mmol/L 27 21(L)  Calcium 8.9 - 10.3 mg/dL 8.6(L) 9.1    LFT Hepatic  Function Latest Ref Rng & Units 05/18/2017  Total Protein 6.5 - 8.1 g/dL 7.9  Albumin 3.5 - 5.0 g/dL 3.8  AST 15 - 41 U/L 451(H)  ALT 17 - 63 U/L 352(H)  Alk Phosphatase 38 - 126 U/L 64  Total Bilirubin 0.3 - 1.2 mg/dL 3.9(H)  Bilirubin, Direct 0.1 - 0.5 mg/dL 1.5(H)     STUDIES: Dg Chest 2 View  Result Date: 05/20/2017 CLINICAL DATA:  Three weeks of shortness of breath with productive cough. Current smoker. EXAM: CHEST  2 VIEW COMPARISON:  None in PACs FINDINGS: The left lung is well-expanded. On the right there is mild volume loss at the base with small effusion and probable infiltrate in the lower lobe. The cardiac silhouette is enlarged. The pulmonary vascularity is engorged centrally with mild cephalization. There is calcification in the wall of the aortic arch. IMPRESSION: Right lower lobe atelectasis or pneumonia with small right pleural effusion. Followup PA and lateral chest X-ray is recommended in 3-4 weeks following trial of antibiotic therapy to ensure resolution and exclude underlying malignancy. Mild cardiomegaly and pulmonary vascular congestion consistent with low-grade CHF. Thoracic aortic atherosclerosis. Electronically Signed   By: David  Martinique M.D.   On: 06/02/2017 12:39   Ct Angio Chest Pe W And/or Wo Contrast  Result Date: 05/25/2017 CLINICAL DATA:  Shortness of breath with exertion for 3 weeks. Symptoms are worse while lying flat. EXAM: CT ANGIOGRAPHY CHEST WITH CONTRAST TECHNIQUE: Multidetector CT imaging of the chest was performed using the standard protocol during bolus administration of intravenous contrast. Multiplanar CT image reconstructions and MIPs were obtained to evaluate the vascular anatomy. CONTRAST:  75 mL Isovue 370 COMPARISON:  Chest radiograph 05/13/2017 FINDINGS: Cardiovascular: Contrast injection is sufficient to demonstrate satisfactory opacification of the pulmonary arteries to the segmental level. There is a filling defect within the right lower lobe  pulmonary artery distribution, extending into the posterior and lateral basal segmental arteries. The main pulmonary artery is within normal limits for size. There is no CT evidence of acute right heart strain. There is calcific aortic atherosclerosis and multifocal coronary artery calcification. There is a normal 3-vessel arch branching pattern. Heart size is mildly enlarged, without pericardial effusion. Mediastinum/Nodes: No mediastinal, hilar or axillary lymphadenopathy. The visualized thyroid and thoracic esophageal course are unremarkable. Lungs/Pleura: There is a medium-sized right pleural effusion with associated atelectasis. The airways are patent. No pulmonary nodules or masses. No pneumothorax. Upper Abdomen: Contrast bolus timing is not optimized for evaluation of the abdominal organs. Small volume right upper quadrant ascites. There is relative hypertrophy of the left hepatic lobe with a mildly nodular liver contour. There is also a small amount of fluid in the left upper quadrant. The spleen is small. Normal pancreas. Visualized portions of the kidneys are normal. Normal adrenal glands. Musculoskeletal: No chest wall abnormality. No acute or significant osseous findings. Review  of the MIP images confirms the above findings. IMPRESSION: 1. Right lower lobe pulmonary embolus involving the lateral and posterior basal segmental arteries. No right heart strain by CT. 2. Medium-sized right pleural effusion with associated atelectasis. 3. A small volume of right and left upper quadrant abdominal ascites with relative left hepatic lobe hypertrophy and mildly nodular hepatic contour that may indicate underlying hepatic cirrhosis. Nonemergent liver ultrasound or abdominal MRI might be helpful for further characterization. 4. 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 Critical Value/emergent results were called by telephone at the time of interpretation on 05/17/2017 at 1:43 pm to Dr. Rudene Re , who verbally acknowledged these results. Electronically Signed   By: Ulyses Jarred M.D.   On: 05/23/2017 13:48   US Abdomen Complete  Result Date: 05/12/2017 CLINICAL DATA:  Hepatic cirrhosis EXAM: ABDOMEN ULTRASOUND COMPLETE COMPARISON:  None in PACs FINDINGS: Gallbladder: The gallbladder is adequately distended. There is mild wall thickening to 4 mm. There is no pericholecystic fluid or positive sonographic Murphy's sign. No stones or sludge are observed. Common bile duct: Diameter: 3 mm Liver: The hepatic echotexture is increased. In the right hepatic lobe there is a hyperechoic solid-appearing focus measuring 2.7 x 2.3 x 2.5 cm. There is no intrahepatic ductal dilation. There is a small amount of ascites adjacent to the liver. Portal vein is patent on color Doppler imaging with normal direction of blood flow towards the liver. IVC: No abnormality visualized. Pancreas: The pancreas was obscured by bowel gas. Spleen: The pancreas is small measuring 3.4 cm in length. Right Kidney: Length: 10.5 cm. Echogenicity within normal limits. No mass or hydronephrosis visualized. Left Kidney: Length: 10.9 cm. Echogenicity within normal limits. No mass or hydronephrosis visualized. Abdominal aorta: Limited visualization due to bowel gas. The maximal measured diameter is seen proximally 2.6 cm. There is a small amount of ascites. There is a right pleural effusion. Other findings: None. IMPRESSION: Mild gallbladder wall thickening may be related to a small amount of ascites noted adjacent to the liver. No definite pericholecystic fluid is currently visible. No gallstones or sonographic evidence of acute cholecystitis. Increased hepatic echotexture most compatible with fatty infiltrative change or the patient's known cirrhosis. A hyperechoic focus in the right hepatic lobe is most compatible with a hemangioma. However, given the history of cirrhosis, hepatic protocol MRI is recommended. Small amount of ascites  adjacent to the liver. There is a right pleural effusion. Electronically Signed   By: David  Martinique M.D.   On: 05/12/2017 13:06   US Venous Img Lower Bilateral  Result Date: 05/12/2017 CLINICAL DATA:  History of pulmonary embolism. History of smoking. Evaluate for DVT. EXAM: BILATERAL LOWER EXTREMITY VENOUS DOPPLER ULTRASOUND TECHNIQUE: Gray-scale sonography with graded compression, as well as color Doppler and duplex ultrasound were performed to evaluate the lower extremity deep venous systems from the level of the common femoral vein and including the common femoral, femoral, profunda femoral, popliteal and calf veins including the posterior tibial, peroneal and gastrocnemius veins when visible. The superficial great saphenous vein was also interrogated. Spectral Doppler was utilized to evaluate flow at rest and with distal augmentation maneuvers in the common femoral, femoral and popliteal veins. COMPARISON:  Chest CTA - 05/30/2017 FINDINGS: RIGHT LOWER EXTREMITY Common Femoral Vein: No evidence of thrombus. Normal compressibility, respiratory phasicity and response to augmentation. Saphenofemoral Junction: No evidence of thrombus. Normal compressibility and flow on color Doppler imaging. Profunda Femoral Vein: No evidence of thrombus. Normal compressibility and flow on color Doppler imaging. Femoral  Vein: No evidence of thrombus. Normal compressibility, respiratory phasicity and response to augmentation. Popliteal Vein: No evidence of thrombus. Normal compressibility, respiratory phasicity and response to augmentation. Calf Veins: No evidence of thrombus. Normal compressibility and flow on color Doppler imaging. Superficial Great Saphenous Vein: No evidence of thrombus. Normal compressibility. Venous Reflux:  None. Other Findings:  None. LEFT LOWER EXTREMITY Common Femoral Vein: No evidence of thrombus. Normal compressibility, respiratory phasicity and response to augmentation. Saphenofemoral Junction: No  evidence of thrombus. Normal compressibility and flow on color Doppler imaging. Profunda Femoral Vein: No evidence of thrombus. Normal compressibility and flow on color Doppler imaging. Femoral Vein: No evidence of thrombus. Normal compressibility, respiratory phasicity and response to augmentation. Popliteal Vein: No evidence of thrombus. Normal compressibility, respiratory phasicity and response to augmentation. Calf Veins: No evidence of thrombus. Normal compressibility and flow on color Doppler imaging. Superficial Great Saphenous Vein: No evidence of thrombus. Normal compressibility. Venous Reflux:  None. Other Findings:  None. IMPRESSION: No evidence of DVT within either lower extremity. Electronically Signed   By: Sandi Mariscal M.D.   On: 05/12/2017 09:59      Impression / Plan:   HARROL NOVELLO is a 66 y.o. male with h/o ETOH and tobacco use, exertional SOB, swelling of legs, new diagnosis of PE, cardiomyopathy, new onset of A Fib on eliquis, also with abnormal LFTs, probable cirrhosis, and liver lesion  Abnormal LFTs: mostly secondary to demand ischemia in the setting of CMP and A Fib. However, need to rule out secondary causes, ordered labs He probably has cirrhosis based on imaging, thrombocytopenia and ascites. Work up for secondary causes as above Monitor LFTs daily Agree with lasix 41m daily, add spironolactone 549mdaily 2gm sodium diet Will need EGD for variceal screening as outpt. There is no evidence of recent or active GI bleed He does not have PSE and coagulopathy Recommend AFP and Mri liver mass protocol to delineate liver lesion seen [place] will also need colonoscopy for colon cancer screening as he is over due for his age  [id number] for involving me in the care of this patient. We will follow     LOS: 2 days   RoSherri SearMD  05/13/2017, 1:05 AM   Note: This dictation was prepared with Dragon dictation along with smaller phrase technology. Any transcriptional errors  that result from this process are unintentional.

## 2017-05-12 NOTE — Progress Notes (Signed)
*  PRELIMINARY RESULTS* Echocardiogram 2D Echocardiogram has been performed.  Ryan Banks, Ryan Banks 05/12/2017, 2:02 PM

## 2017-05-12 NOTE — Progress Notes (Signed)
[employer] [facility] [place] [facility]   PATIENT NAME: Ryan Banks    MR#:  [id number]  DATE OF BIRTH:  12/01/1950  SUBJECTIVE:   Patient here due to shortness of breath and noted to have an acute pulmonary embolism, also noted to be in acute kidney injury and also with CT scan/ultrasound findings suggestive of liver cirrhosis.  REVIEW OF SYSTEMS:    Review of Systems  Constitutional: Negative for chills and fever.  HENT: Negative for congestion and tinnitus.   Eyes: Negative for blurred vision and double vision.  Respiratory: Positive for shortness of breath. Negative for cough and wheezing.   Cardiovascular: Negative for chest pain, orthopnea and PND.  Gastrointestinal: Negative for abdominal pain, diarrhea, nausea and vomiting.  Genitourinary: Negative for dysuria and hematuria.  Neurological: Positive for weakness. Negative for dizziness, sensory change and focal weakness.  All other systems reviewed and are negative.   Nutrition: Heart healthy Tolerating Diet: Yes Tolerating PT: Await Eval.    DRUG ALLERGIES:   Allergies  Allergen Reactions  . Penicillins Rash    Has patient had a PCN reaction causing immediate rash, facial/tongue/throat swelling, SOB or lightheadedness with hypotension: Yes Has patient had a PCN reaction causing severe rash involving mucus membranes or skin necrosis: No Has patient had a PCN reaction that required hospitalization: No Has patient had a PCN reaction occurring within the last 10 years: No If all of the above answers are "NO", then may proceed with Cephalosporin use.     VITALS:  Blood pressure 103/60, pulse (!) 54, temperature (!) 97.4 F (36.3 C), temperature source Oral, resp. rate 16, height 5\' 5"  (1.651 m), weight 83.1 kg (183 lb 1.6 oz), SpO2 100 %.  PHYSICAL EXAMINATION:   Physical Exam  GENERAL:  66 y.o.-year-old patient lying in bed in mild Resp. Distress.  EYES: Pupils equal, round, reactive to light and  accommodation. No scleral icterus. Extraocular muscles intact.  HEENT: Head atraumatic, normocephalic. Oropharynx and nasopharynx clear.  NECK:  Supple, no jugular venous distention. No thyroid enlargement, no tenderness.  LUNGS: Good a/e b/l, no wheezing, bibasilar rales, rhonchi. No use of accessory muscles of respiration.  CARDIOVASCULAR: S1, S2 normal. No murmurs, rubs, or gallops.  ABDOMEN: Soft, nontender, nondistended. Bowel sounds present. No organomegaly or mass.  EXTREMITIES: No cyanosis, clubbing or edema b/l.    NEUROLOGIC: Cranial nerves II through XII are intact. No focal Motor or sensory deficits b/l.   PSYCHIATRIC: The patient is alert and oriented x 3.  SKIN: No obvious rash, lesion, or ulcer.    LABORATORY PANEL:   CBC Recent Labs  Lab 05/12/17 0602  WBC 8.3  HGB 15.0  HCT 45.5  PLT 95*   ------------------------------------------------------------------------------------------------------------------  Chemistries  Recent Labs  Lab 05/25/2017 1211 06/04/2017 2244 05/12/17 0445  NA 132*  --  139  K 4.4  --  3.7  CL 99*  --  105  CO2 21*  --  27  GLUCOSE 180*  --  157*  BUN 35*  --  17  CREATININE 1.56*  --  1.01  CALCIUM 9.1  --  8.6*  MG  --  2.0  --   AST 451*  --   --   ALT 352*  --   --   ALKPHOS 64  --   --   BILITOT 3.9*  --   --    ------------------------------------------------------------------------------------------------------------------  Cardiac Enzymes Recent Labs  Lab 05/12/2017 2244  TROPONINI 0.06*   ------------------------------------------------------------------------------------------------------------------  RADIOLOGY:  Dg Chest 2 View  Result Date: 06/01/2017 CLINICAL DATA:  Three weeks of shortness of breath with productive cough. Current smoker. EXAM: CHEST  2 VIEW COMPARISON:  None in PACs FINDINGS: The left lung is well-expanded. On the right there is mild volume loss at the base with small effusion and probable  infiltrate in the lower lobe. The cardiac silhouette is enlarged. The pulmonary vascularity is engorged centrally with mild cephalization. There is calcification in the wall of the aortic arch. IMPRESSION: Right lower lobe atelectasis or pneumonia with small right pleural effusion. Followup PA and lateral chest X-ray is recommended in 3-4 weeks following trial of antibiotic therapy to ensure resolution and exclude underlying malignancy. Mild cardiomegaly and pulmonary vascular congestion consistent with low-grade CHF. Thoracic aortic atherosclerosis. Electronically Signed   By: David  SwazilandJordan M.D.   On: 05/13/2017 12:39   Ct Angio Chest Pe W And/or Wo Contrast  Result Date: 06/04/2017 CLINICAL DATA:  Shortness of breath with exertion for 3 weeks. Symptoms are worse while lying flat. EXAM: CT ANGIOGRAPHY CHEST WITH CONTRAST TECHNIQUE: Multidetector CT imaging of the chest was performed using the standard protocol during bolus administration of intravenous contrast. Multiplanar CT image reconstructions and MIPs were obtained to evaluate the vascular anatomy. CONTRAST:  75 mL Isovue 370 COMPARISON:  Chest radiograph 05/31/2017 FINDINGS: Cardiovascular: Contrast injection is sufficient to demonstrate satisfactory opacification of the pulmonary arteries to the segmental level. There is a filling defect within the right lower lobe pulmonary artery distribution, extending into the posterior and lateral basal segmental arteries. The main pulmonary artery is within normal limits for size. There is no CT evidence of acute right heart strain. There is calcific aortic atherosclerosis and multifocal coronary artery calcification. There is a normal 3-vessel arch branching pattern. Heart size is mildly enlarged, without pericardial effusion. Mediastinum/Nodes: No mediastinal, hilar or axillary lymphadenopathy. The visualized thyroid and thoracic esophageal course are unremarkable. Lungs/Pleura: There is a medium-sized right  pleural effusion with associated atelectasis. The airways are patent. No pulmonary nodules or masses. No pneumothorax. Upper Abdomen: Contrast bolus timing is not optimized for evaluation of the abdominal organs. Small volume right upper quadrant ascites. There is relative hypertrophy of the left hepatic lobe with a mildly nodular liver contour. There is also a small amount of fluid in the left upper quadrant. The spleen is small. Normal pancreas. Visualized portions of the kidneys are normal. Normal adrenal glands. Musculoskeletal: No chest wall abnormality. No acute or significant osseous findings. Review of the MIP images confirms the above findings. IMPRESSION: 1. Right lower lobe pulmonary embolus involving the lateral and posterior basal segmental arteries. No right heart strain by CT. 2. Medium-sized right pleural effusion with associated atelectasis. 3. A small volume of right and left upper quadrant abdominal ascites with relative left hepatic lobe hypertrophy and mildly nodular hepatic contour that may indicate underlying hepatic cirrhosis. Nonemergent liver ultrasound or abdominal MRI might be helpful for further characterization. 4. 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 Critical Value/emergent results were called by telephone at the time of interpretation on 05/21/2017 at 1:43 pm to Dr. Nita SickleAROLINA VERONESE , who verbally acknowledged these results. Electronically Signed   By: Deatra RobinsonKevin  Herman M.D.   On: 05/28/2017 13:48   [place] Abdomen Complete  Result Date: 05/12/2017 CLINICAL DATA:  Hepatic cirrhosis EXAM: ABDOMEN ULTRASOUND COMPLETE COMPARISON:  None in PACs FINDINGS: Gallbladder: The gallbladder is adequately distended. There is mild wall thickening to 4 mm. There is no pericholecystic fluid or positive  sonographic Murphy's sign. No stones or sludge are observed. Common bile duct: Diameter: 3 mm Liver: The hepatic echotexture is increased. In the right hepatic lobe there is a  hyperechoic solid-appearing focus measuring 2.7 x 2.3 x 2.5 cm. There is no intrahepatic ductal dilation. There is a small amount of ascites adjacent to the liver. Portal vein is patent on color Doppler imaging with normal direction of blood flow towards the liver. IVC: No abnormality visualized. Pancreas: The pancreas was obscured by bowel gas. Spleen: The pancreas is small measuring 3.4 cm in length. Right Kidney: Length: 10.5 cm. Echogenicity within normal limits. No mass or hydronephrosis visualized. Left Kidney: Length: 10.9 cm. Echogenicity within normal limits. No mass or hydronephrosis visualized. Abdominal aorta: Limited visualization due to bowel gas. The maximal measured diameter is seen proximally 2.6 cm. There is a small amount of ascites. There is a right pleural effusion. Other findings: None. IMPRESSION: Mild gallbladder wall thickening may be related to a small amount of ascites noted adjacent to the liver. No definite pericholecystic fluid is currently visible. No gallstones or sonographic evidence of acute cholecystitis. Increased hepatic echotexture most compatible with fatty infiltrative change or the patient's known cirrhosis. A hyperechoic focus in the right hepatic lobe is most compatible with a hemangioma. However, given the history of cirrhosis, hepatic protocol MRI is recommended. Small amount of ascites adjacent to the liver. There is a right pleural effusion. Electronically Signed   By: David  [place] M.D.   On: 05/12/2017 13:06   US Venous Img Lower Bilateral  Result Date: 05/12/2017 CLINICAL DATA:  History of pulmonary embolism. History of smoking. Evaluate for DVT. EXAM: BILATERAL LOWER EXTREMITY VENOUS DOPPLER ULTRASOUND TECHNIQUE: Gray-scale sonography with graded compression, as well as color Doppler and duplex ultrasound were performed to evaluate the lower extremity deep venous systems from the level of the common femoral vein and including the common femoral, femoral,  profunda femoral, popliteal and calf veins including the posterior tibial, peroneal and gastrocnemius veins when visible. The superficial great saphenous vein was also interrogated. Spectral Doppler was utilized to evaluate flow at rest and with distal augmentation maneuvers in the common femoral, femoral and popliteal veins. COMPARISON:  Chest CTA - 04-Jun-2017 FINDINGS: RIGHT LOWER EXTREMITY Common Femoral Vein: No evidence of thrombus. Normal compressibility, respiratory phasicity and response to augmentation. Saphenofemoral Junction: No evidence of thrombus. Normal compressibility and flow on color Doppler imaging. Profunda Femoral Vein: No evidence of thrombus. Normal compressibility and flow on color Doppler imaging. Femoral Vein: No evidence of thrombus. Normal compressibility, respiratory phasicity and response to augmentation. Popliteal Vein: No evidence of thrombus. Normal compressibility, respiratory phasicity and response to augmentation. Calf Veins: No evidence of thrombus. Normal compressibility and flow on color Doppler imaging. Superficial Great Saphenous Vein: No evidence of thrombus. Normal compressibility. Venous Reflux:  None. Other Findings:  None. LEFT LOWER EXTREMITY Common Femoral Vein: No evidence of thrombus. Normal compressibility, respiratory phasicity and response to augmentation. Saphenofemoral Junction: No evidence of thrombus. Normal compressibility and flow on color Doppler imaging. Profunda Femoral Vein: No evidence of thrombus. Normal compressibility and flow on color Doppler imaging. Femoral Vein: No evidence of thrombus. Normal compressibility, respiratory phasicity and response to augmentation. Popliteal Vein: No evidence of thrombus. Normal compressibility, respiratory phasicity and response to augmentation. Calf Veins: No evidence of thrombus. Normal compressibility and flow on color Doppler imaging. Superficial Great Saphenous Vein: No evidence of thrombus. Normal  compressibility. Venous Reflux:  None. Other Findings:  None. IMPRESSION: No evidence of  DVT within either lower extremity. Electronically Signed   By: Simonne Come M.D.   On: 05/12/2017 09:59     ASSESSMENT AND PLAN:   66 year old male with no significant past medical history presented to the hospital due to shortness of breath.  1. Acute respiratory failure with hypoxia-secondary to CHF and also pulmonary embolism. -Continue diuresis with IV Lasix, follow I's and O's and daily weights. -We'll switch from subcutaneous Lovenox to Eliquis for anticoagulation for the PE, wean off oxygen as tolerated.  2. Acute pulmonary embolism-this is a unprovoked pulmonary embolism. -Patient was on subcutaneous Lovenox, I will switch to Eliquis. Dopplers of lower extremity negative for DVT.  3. CHF-acute systolic dysfunction. 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 Echo Cardizem showing ejection fraction of 30-35%. -Continue diuresis with IV Lasix, follow I's and O's and daily weights. Continue carvedilol, Lisinopril.   - await Cardiology input.   4. A. Fib - new onset in nature.  - cont. Coreg, Cardizem for rate control.  - cont. Eliquis.   5. Liver Cirrhosis - patient was incidentally noted to have liver cirrhosis on his CT chest. Abdominal ultrasound today confirms that. Patient does have a history of alcohol abuse. No evidence of hepatic encephalopathy or ascites. -Await further gastroenterology input.  6. History of alcohol abuse-continue CIWA protocol, continue thiamine, folate.  7. Thrombocytopenia-secondary to alcohol abuse and liver cirrhosis. Follow platelet count, no acute bleeding.  8. Acute kidney injury-improved with IV fluid hydration and will continue to monitor.  All the records are reviewed and case discussed with Care Management/Social Worker. Management plans discussed with the patient, family and they are in agreement.  CODE STATUS:  Full code  DVT Prophylaxis: Eliquis  TOTAL TIME TAKING CARE OF THIS PATIENT: 30 minutes.   POSSIBLE D/C IN 2-3 DAYS, DEPENDING ON CLINICAL CONDITION.   Houston Siren M.D on 05/12/2017 at 3:53 PM  Between 7am to 6pm - Pager - [phone]  After 6pm go to [email or web address] - Therapist, [facility] Hospital[facility]  Office  [phone]  CC: Primary care physician; Center, [facility]

## 2017-05-13 ENCOUNTER — Inpatient Hospital Stay: Payer: Medicare Other

## 2017-05-13 ENCOUNTER — Encounter: Payer: Self-pay | Admitting: Nurse Practitioner

## 2017-05-13 DIAGNOSIS — I2699 Other pulmonary embolism without acute cor pulmonale: Principal | ICD-10-CM

## 2017-05-13 DIAGNOSIS — K769 Liver disease, unspecified: Secondary | ICD-10-CM

## 2017-05-13 DIAGNOSIS — K7031 Alcoholic cirrhosis of liver with ascites: Secondary | ICD-10-CM

## 2017-05-13 DIAGNOSIS — I469 Cardiac arrest, cause unspecified: Secondary | ICD-10-CM

## 2017-05-13 DIAGNOSIS — R579 Shock, unspecified: Secondary | ICD-10-CM

## 2017-05-13 DIAGNOSIS — R001 Bradycardia, unspecified: Secondary | ICD-10-CM

## 2017-05-13 DIAGNOSIS — D696 Thrombocytopenia, unspecified: Secondary | ICD-10-CM

## 2017-05-13 DIAGNOSIS — I4892 Unspecified atrial flutter: Secondary | ICD-10-CM

## 2017-05-13 DIAGNOSIS — N179 Acute kidney failure, unspecified: Secondary | ICD-10-CM

## 2017-05-13 DIAGNOSIS — I420 Dilated cardiomyopathy: Secondary | ICD-10-CM

## 2017-05-13 DIAGNOSIS — K7469 Other cirrhosis of liver: Secondary | ICD-10-CM

## 2017-05-13 LAB — BLOOD GAS, ARTERIAL
ACID-BASE DEFICIT: 11.3 mmol/L — AB (ref 0.0–2.0)
BICARBONATE: 13.5 mmol/L — AB (ref 20.0–28.0)
FIO2: 100
MECHVT: 600 mL
O2 SAT: 82.5 %
PATIENT TEMPERATURE: 37
PCO2 ART: 28 mmHg — AB (ref 32.0–48.0)
PH ART: 7.29 — AB (ref 7.350–7.450)
PO2 ART: 53 mmHg — AB (ref 83.0–108.0)
RATE: 20 resp/min

## 2017-05-13 LAB — HEPATIC FUNCTION PANEL
ALK PHOS: 61 U/L (ref 38–126)
ALT: 928 U/L — AB (ref 17–63)
AST: 1907 U/L — AB (ref 15–41)
Albumin: 3.4 g/dL — ABNORMAL LOW (ref 3.5–5.0)
BILIRUBIN INDIRECT: 3.2 mg/dL — AB (ref 0.3–0.9)
Bilirubin, Direct: 2.8 mg/dL — ABNORMAL HIGH (ref 0.1–0.5)
TOTAL PROTEIN: 6.7 g/dL (ref 6.5–8.1)
Total Bilirubin: 6 mg/dL — ABNORMAL HIGH (ref 0.3–1.2)

## 2017-05-13 LAB — CBC
HCT: 40.4 % (ref 40.0–52.0)
HCT: 50.9 % (ref 40.0–52.0)
Hemoglobin: 13.4 g/dL (ref 13.0–18.0)
Hemoglobin: 16.2 g/dL (ref 13.0–18.0)
MCH: 31.9 pg (ref 26.0–34.0)
MCH: 31.5 pg (ref 26.0–34.0)
MCHC: 33.3 g/dL (ref 32.0–36.0)
MCHC: 31.9 g/dL — ABNORMAL LOW (ref 32.0–36.0)
MCV: 96 fL (ref 80.0–100.0)
MCV: 98.6 fL (ref 80.0–100.0)
PLATELETS: 83 10*3/uL — AB (ref 150–440)
PLATELETS: 84 10*3/uL — AB (ref 150–440)
RBC: 4.21 MIL/uL — ABNORMAL LOW (ref 4.40–5.90)
RBC: 5.16 MIL/uL (ref 4.40–5.90)
RDW: 14.3 % (ref 11.5–14.5)
RDW: 14.5 % (ref 11.5–14.5)
WBC: 8.5 10*3/uL (ref 3.8–10.6)
WBC: 2.7 10*3/uL — AB (ref 3.8–10.6)

## 2017-05-13 LAB — BASIC METABOLIC PANEL
ANION GAP: 10 (ref 5–15)
BUN: 61 mg/dL — ABNORMAL HIGH (ref 6–20)
CALCIUM: 8.4 mg/dL — AB (ref 8.9–10.3)
CHLORIDE: 96 mmol/L — AB (ref 101–111)
CO2: 21 mmol/L — AB (ref 22–32)
Creatinine, Ser: 2.79 mg/dL — ABNORMAL HIGH (ref 0.61–1.24)
GFR calc Af Amer: 25 mL/min — ABNORMAL LOW (ref >60)
GFR calc non Af Amer: 22 mL/min — ABNORMAL LOW (ref >60)
GLUCOSE: 129 mg/dL — AB (ref 65–99)
Potassium: 4.8 mmol/L (ref 3.5–5.1)
Sodium: 127 mmol/L — ABNORMAL LOW (ref 135–145)

## 2017-05-13 LAB — COMPREHENSIVE METABOLIC PANEL
ALT: 1087 U/L — ABNORMAL HIGH (ref 17–63)
ANION GAP: 14 (ref 5–15)
AST: 1975 U/L — ABNORMAL HIGH (ref 15–41)
Albumin: 2.9 g/dL — ABNORMAL LOW (ref 3.5–5.0)
Alkaline Phosphatase: 76 U/L (ref 38–126)
BILIRUBIN TOTAL: 5.8 mg/dL — AB (ref 0.3–1.2)
BUN: 70 mg/dL — ABNORMAL HIGH (ref 6–20)
CO2: 16 mmol/L — AB (ref 22–32)
Calcium: 7.6 mg/dL — ABNORMAL LOW (ref 8.9–10.3)
Chloride: 92 mmol/L — ABNORMAL LOW (ref 101–111)
Creatinine, Ser: 3.92 mg/dL — ABNORMAL HIGH (ref 0.61–1.24)
GFR, EST AFRICAN AMERICAN: 17 mL/min — AB (ref >60)
GFR, EST NON AFRICAN AMERICAN: 15 mL/min — AB (ref >60)
Glucose, Bld: 145 mg/dL — ABNORMAL HIGH (ref 65–99)
Potassium: 6.1 mmol/L — ABNORMAL HIGH (ref 3.5–5.1)
SODIUM: 122 mmol/L — AB (ref 135–145)
TOTAL PROTEIN: 6.7 g/dL (ref 6.5–8.1)

## 2017-05-13 LAB — HEPATITIS PANEL, ACUTE
HEP A IGM: NEGATIVE
HEP B S AG: POSITIVE — AB
Hep B C IgM: NEGATIVE

## 2017-05-13 LAB — PROTIME-INR
INR: 11.14
INR: 11.28
PROTHROMBIN TIME: 86.1 s — AB (ref 11.4–15.2)
Prothrombin Time: 87 seconds — ABNORMAL HIGH (ref 11.4–15.2)

## 2017-05-13 LAB — MRSA PCR SCREENING: MRSA by PCR: NEGATIVE

## 2017-05-13 LAB — TROPONIN I: Troponin I: 6.5 ng/mL (ref <0.03)

## 2017-05-13 LAB — GLUCOSE, CAPILLARY: GLUCOSE-CAPILLARY: 106 mg/dL — AB (ref 65–99)

## 2017-05-13 MED ORDER — FOLIC ACID 1 MG PO TABS: 1.0000 mg | ORAL_TABLET | Freq: Every day | ORAL | Status: DC

## 2017-05-13 MED ORDER — ATROPINE SULFATE 1 MG/10ML IJ SOSY
0.5000 mg | PREFILLED_SYRINGE | Freq: Once | INTRAMUSCULAR | Status: AC
  Administered 2017-05-13: 0.5 mg via INTRAVENOUS

## 2017-05-13 MED ORDER — SODIUM CHLORIDE 0.9 % IV BOLUS (SEPSIS): 250.0000 mL | Freq: Once | INTRAVENOUS | Status: DC

## 2017-05-13 MED ORDER — NOREPINEPHRINE BITARTRATE 1 MG/ML IV SOLN
0.0000 ug/min | INTRAVENOUS | Status: DC
  Administered 2017-05-13: 40 ug/min via INTRAVENOUS
  Filled 2017-05-13: qty 4

## 2017-05-13 MED ORDER — SODIUM CHLORIDE 0.9 % IV SOLN
1.0000 g | Freq: Once | INTRAVENOUS | Status: AC
  Administered 2017-05-13: 1 g via INTRAVENOUS
  Filled 2017-05-13: qty 10

## 2017-05-13 MED ORDER — ALUM & MAG HYDROXIDE-SIMETH 200-200-20 MG/5ML PO SUSP
30.0000 mL | Freq: Four times a day (QID) | ORAL | Status: DC | PRN
  Filled 2017-05-13: qty 30

## 2017-05-13 MED ORDER — SODIUM BICARBONATE 8.4 % IV SOLN
50.0000 meq | Freq: Once | INTRAVENOUS | Status: AC
  Administered 2017-05-13: 50 meq via INTRAVENOUS
  Filled 2017-05-13: qty 50

## 2017-05-13 MED ORDER — ASPIRIN 325 MG PO TABS: 325.0000 mg | ORAL_TABLET | Freq: Every day | ORAL | Status: DC

## 2017-05-13 MED ORDER — DOPAMINE-DEXTROSE 3.2-5 MG/ML-% IV SOLN
0.0000 ug/kg/min | INTRAVENOUS | Status: DC
  Administered 2017-05-13: 5 ug/kg/min via INTRAVENOUS
  Filled 2017-05-13 (×2): qty 250

## 2017-05-13 MED ORDER — ATROPINE SULFATE 1 MG/10ML IJ SOSY
PREFILLED_SYRINGE | INTRAMUSCULAR | Status: AC
  Filled 2017-05-13: qty 10

## 2017-05-13 MED ORDER — APIXABAN 5 MG PO TABS: 10.0000 mg | ORAL_TABLET | Freq: Two times a day (BID) | ORAL | Status: DC

## 2017-05-13 MED ORDER — FENTANYL CITRATE (PF) 100 MCG/2ML IJ SOLN
50.0000 ug | INTRAMUSCULAR | Status: AC | PRN
  Administered 2017-05-13 – 2017-05-14 (×3): 50 ug via INTRAVENOUS
  Filled 2017-05-13 (×3): qty 2

## 2017-05-13 MED ORDER — APIXABAN 5 MG PO TABS: 5.0000 mg | ORAL_TABLET | Freq: Two times a day (BID) | ORAL | Status: DC

## 2017-05-13 MED ORDER — PANTOPRAZOLE SODIUM 40 MG IV SOLR: 40.0000 mg | INTRAVENOUS | Status: DC

## 2017-05-13 MED ORDER — PANTOPRAZOLE SODIUM 40 MG PO TBEC: 40.0000 mg | DELAYED_RELEASE_TABLET | Freq: Every day | ORAL | Status: DC

## 2017-05-13 MED ORDER — DOPAMINE-DEXTROSE 3.2-5 MG/ML-% IV SOLN
0.0000 ug/kg/min | INTRAVENOUS | Status: DC
  Administered 2017-05-13: 17 ug/kg/min via INTRAVENOUS
  Administered 2017-05-13: 20 ug/kg/min via INTRAVENOUS

## 2017-05-13 MED ORDER — FENTANYL CITRATE (PF) 100 MCG/2ML IJ SOLN
50.0000 ug | INTRAMUSCULAR | Status: DC | PRN
  Administered 2017-05-14 (×3): 50 ug via INTRAVENOUS
  Filled 2017-05-13 (×2): qty 2

## 2017-05-13 MED ORDER — SODIUM BICARBONATE 8.4 % IV SOLN
50.0000 meq | Freq: Once | INTRAVENOUS | Status: AC
  Administered 2017-05-13: 50 meq via INTRAVENOUS

## 2017-05-13 MED ORDER — VASOPRESSIN 20 UNIT/ML IV SOLN
0.0300 [IU]/min | INTRAVENOUS | Status: DC
  Filled 2017-05-13: qty 2

## 2017-05-13 MED ORDER — NOREPINEPHRINE BITARTRATE 1 MG/ML IV SOLN
0.0000 ug/min | INTRAVENOUS | Status: DC
  Administered 2017-05-13: 40 ug/min via INTRAVENOUS
  Administered 2017-05-14: 75 ug/min via INTRAVENOUS
  Administered 2017-05-14: 36 ug/min via INTRAVENOUS
  Administered 2017-05-14: 40 ug/min via INTRAVENOUS
  Filled 2017-05-13 (×4): qty 16

## 2017-05-13 MED ORDER — SODIUM BICARBONATE 8.4 % IV SOLN
100.0000 meq | INTRAVENOUS | Status: AC
  Administered 2017-05-13: 100 meq via INTRAVENOUS
  Filled 2017-05-13: qty 50

## 2017-05-13 MED FILL — Medication: Qty: 1 | Status: AC

## 2017-05-13 NOTE — Consult Note (Signed)
Cardiology Consult    Patient ID: Ryan Banks MRN: [id number], DOB/AGE: [birth year]   Admit date: 05/21/2017 Date of Consult: 05/13/2017  Primary Physician: [facility] Primary Cardiologist: new Requesting Provider: Laban Emperor  Patient Profile    Ryan Banks is a 66 y.o. male with a history of ETOH abuse, who is being seen today for the evaluation of LV dysfxn, PE, bradycardia, Afib/flutter at the request of Dr. Imogene Burn.  Past Medical History   Past Medical History:  Diagnosis Date  . ETOH abuse   . Tobacco abuse    a. smokes one cigar/day.    History reviewed. No pertinent surgical history.   Allergies  Allergies  Allergen Reactions  . Penicillins Rash    Has patient had a PCN reaction causing immediate rash, facial/tongue/throat swelling, SOB or lightheadedness with hypotension: Yes Has patient had a PCN reaction causing severe rash involving mucus membranes or skin necrosis: No Has patient had a PCN reaction that required hospitalization: No Has patient had a PCN reaction occurring within the last 10 years: No If all of the above answers are "NO", then may proceed with Cephalosporin use.     History of Present Illness    66 y/o ? with a h/o etoh/tob abuse.  No prior cardiac history.  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  Beginning about 3-4 weeks ago, he began to experience progressive dyspnea on exertion.  Due to progressive symptoms, he presented to the emergency department on November 5 and was found to be in atrial fibrillation with rapid ventricular response with rates in the 130s.  Chest x-ray showed mild cardiomegaly and pulmonary vascular congestion.  CT angiogram of the chest revealed a right lower lobe pulmonary embolism and right and left upper quadrant ascites with left hepatic lobe hypertrophy and mildly nodular hepatic contour suggestive of cirrhosis was also incidentally noted.  He was admitted and placed on intravenous heparin as well  as diltiazem and beta-blocker therapy for atrial fibrillation.  Rate control was achieved with oral beta-blocker and diltiazem.  Heparin was switched to Eliquis.  MRI of the abdomen was performed in the setting of cirrhosis and confirmed morphologic features of the liver consistent with cirrhosis.  There was also a 2.7 cm indeterminate mass within the liver.  echocardiogram was performed and showed LV dysfunction with an EF of 30-35% and grade 1 diastolic dysfunction.  This was presumably a new diagnosis.  LFTs were noted to be mildly abnormal and this morning his AST rose to 1907.  ALT 928.  He has been seen by gastroenterology who suspects that at least some of his ascites and LFT abnormality is related to low output heart failure.  His troponins have been mildly elevated 0.05 and 0.06.  INR, which was 1.05 on November 6, is now 11.14.  Due to LV dysfunction and A. fib, we were initially asked to see the patient.  Upon evaluation however, he was noted to be significantly bradycardic and hypotensive with heart rate sometimes dipping into the 30s and blood pressures in the 70s.  He denies chest pain or dyspnea.  He was initially treated with atropine  with minimal rise in heart rate into the 50s.  Dopamine was started and patient was transferred to ICU.  Inpatient Medications    . atropine      . apixaban  10 mg Oral BID   Followed by  . [START ON 05/19/2017] apixaban  5 mg Oral BID  . aspirin  EC  81 mg Oral Daily  . folic acid  1 mg Oral Daily  . multivitamin with minerals  1 tablet Oral Daily  . pantoprazole  40 mg Oral Daily  . sodium chloride flush  3 mL Intravenous Q12H  . thiamine  100 mg Oral Daily   Or  . thiamine  100 mg Intravenous Daily    Family History    No premature CAD.  Social History    Social History   Socioeconomic History  . Marital status: Widowed    Spouse name: Not on file  . Number of children: Not on file  . Years of education: Not on file  . Highest  education level: Not on file  Social Needs  . Financial resource strain: Not on file  . Food insecurity - worry: Not on file  . Food insecurity - inability: Not on file  . Transportation needs - medical: Not on file  . Transportation needs - non-medical: Not on file  Occupational History  . Occupation: contract work    Comment: Youth workermanual labor  Tobacco Use  . Smoking status: Current Every Day Smoker    Types: Cigars  . Smokeless tobacco: Never Used  . Tobacco comment: 1 cigar daily for most of his adult life.  Substance and Sexual Activity  . Alcohol use: Yes    Comment: 20 oz of beer/day.  . Drug use: No  . Sexual activity: Not on file  Other Topics Concern  . Not on file  Social History Narrative  . Not on file     Review of Systems    General:  No chills, fever, night sweats or weight changes.  Cardiovascular:  No chest pain, +++ dyspnea on exertion, no edema, orthopnea, palpitations, paroxysmal nocturnal dyspnea. Dermatological: No rash, lesions/masses Respiratory: No cough, +++ dyspnea Urologic: No hematuria, dysuria Abdominal:   No nausea, vomiting, diarrhea, bright red blood per rectum, melena, or hematemesis Neurologic:  No visual changes, wkns, changes in mental status. All other systems reviewed and are otherwise negative except as noted above.  Physical Exam    Blood pressure (!) 85/50, pulse (!) 39, temperature 98.6 F (37 C), resp. rate 18, height 5\' 5"  (1.651 m), weight 186 lb 14.4 oz (84.8 kg), SpO2 100 %.  General: Pleasant, NAD Psych: Flat affect. Neuro: Alert and oriented X 3. Moves all extremities spontaneously. HEENT: Normal  Neck: Supple without bruits.  +++ JVD. Lungs:  Resp regular and unlabored, CTA. Heart: Irregular, distant no s3, s4, or murmurs. Abdomen: Soft, non-tender, non-distended, BS + x 4.  Extremities: Cool, no clubbing, cyanosis.  Trace bilat LE edema. DP/PT/Radials trace.  Labs     Recent Labs    Jul 24, 2016 1211 Jul 24, 2016 2244    TROPONINI 0.05* 0.06*   Lab Results  Component Value Date   WBC 8.5 05/13/2017   HGB 13.4 05/13/2017   HCT 40.4 05/13/2017   MCV 96.0 05/13/2017   PLT 83 (L) 05/13/2017    Recent Labs  Lab 05/13/17 0445 05/13/17 0511  NA  --  127*  K  --  4.8  CL  --  96*  CO2  --  21*  BUN  --  61*  CREATININE  --  2.79*  CALCIUM  --  8.4*  PROT 6.7  --   BILITOT 6.0*  --   ALKPHOS 61  --   ALT 928*  --   AST 1,907*  --   GLUCOSE  --  129*   Lab  Results  Component Value Date   CHOL 167 05/12/2017   HDL 36 (L) 05/12/2017   LDLCALC 93 05/12/2017   TRIG 192 (H) 05/12/2017    Radiology Studies    Dg Chest 2 View  Result Date: 05/08/2017 CLINICAL DATA:  Three weeks of shortness of breath with productive cough. Current smoker. EXAM: CHEST  2 VIEW COMPARISON:  None in PACs FINDINGS: The left lung is well-expanded. On the right there is mild volume loss at the base with small effusion and probable infiltrate in the lower lobe. The cardiac silhouette is enlarged. The pulmonary vascularity is engorged centrally with mild cephalization. There is calcification in the wall of the aortic arch. IMPRESSION: Right lower lobe atelectasis or pneumonia with small right pleural effusion. Followup PA and lateral chest X-ray is recommended in 3-4 weeks following trial of antibiotic therapy to ensure resolution and exclude underlying malignancy. Mild cardiomegaly and pulmonary vascular congestion consistent with low-grade CHF. Thoracic aortic atherosclerosis. Electronically Signed   By: David  SwazilandJordan M.D.   On: 05-01-2017 12:39   Ct Angio Chest Pe W And/or Wo Contrast  Result Date: 05/27/2017 CLINICAL DATA:  Shortness of breath with exertion for 3 weeks. Symptoms are worse while lying flat. EXAM: CT ANGIOGRAPHY CHEST WITH CONTRAST TECHNIQUE: Multidetector CT imaging of the chest was performed using the standard protocol during bolus administration of intravenous contrast. Multiplanar CT image reconstructions  and MIPs were obtained to evaluate the vascular anatomy. CONTRAST:  75 mL Isovue 370 COMPARISON:  Chest radiograph 05-01-2017 FINDINGS: Cardiovascular: Contrast injection is sufficient to demonstrate satisfactory opacification of the pulmonary arteries to the segmental level. There is a filling defect within the right lower lobe pulmonary artery distribution, extending into the posterior and lateral basal segmental arteries. The main pulmonary artery is within normal limits for size. There is no CT evidence of acute right heart strain. There is calcific aortic atherosclerosis and multifocal coronary artery calcification. There is a normal 3-vessel arch branching pattern. Heart size is mildly enlarged, without pericardial effusion. Mediastinum/Nodes: No mediastinal, hilar or axillary lymphadenopathy. The visualized thyroid and thoracic esophageal course are unremarkable. Lungs/Pleura: There is a medium-sized right pleural effusion with associated atelectasis. The airways are patent. No pulmonary nodules or masses. No pneumothorax. Upper Abdomen: Contrast bolus timing is not optimized for evaluation of the abdominal organs. Small volume right upper quadrant ascites. There is relative hypertrophy of the left hepatic lobe with a mildly nodular liver contour. There is also a small amount of fluid in the left upper quadrant. The spleen is small. Normal pancreas. Visualized portions of the kidneys are normal. Normal adrenal glands. Musculoskeletal: No chest wall abnormality. No acute or significant osseous findings. Review of the MIP images confirms the above findings. IMPRESSION: 1. Right lower lobe pulmonary embolus involving the lateral and posterior basal segmental arteries. No right heart strain by CT. 2. Medium-sized right pleural effusion with associated atelectasis. 3. A small volume of right and left upper quadrant abdominal ascites with relative left hepatic lobe hypertrophy and mildly nodular hepatic contour  that may indicate underlying hepatic cirrhosis. Nonemergent liver ultrasound or abdominal MRI might be helpful for further characterization. 4. 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 Critical Value/emergent results were called by telephone at the time of interpretation on 06/03/2017 at 1:43 pm to Dr. Nita SickleAROLINA VERONESE , who verbally acknowledged these results. Electronically Signed   By: Deatra RobinsonKevin  Herman M.D.   On: 05-01-2017 13:48   Mr Abdomen W Wo Contrast  Result Date: 05/13/2017 CLINICAL  DATA:  Known cirrhosis.  Evaluate suspected liver lesion. EXAM: MRI ABDOMEN WITHOUT AND WITH CONTRAST TECHNIQUE: Multiplanar multisequence MR imaging of the abdomen was performed both before and after the administration of intravenous contrast. CONTRAST:  17mL MULTIHANCE GADOBENATE DIMEGLUMINE 529 MG/ML IV SOLN COMPARISON:  Ultrasound 05/12/2017. FINDINGS: Lower chest: Moderate right pleural effusion and small left pleural effusion identified. The heart size is enlarged. Hepatobiliary: The liver appears cirrhotic. There is hypertrophy of the lateral segment of left lobe and caudate lobes of liver. The contour the liver is irregular/nodular. Within segment 7 of the liver there is a T2 hyperintense structure measuring 2.2 cm. The post-contrast T1 weighted images are markedly diminished secondary to motion artifact. This affects the immediate, 45 second delayed, 90 second delayed and 3 minutes delayed images. Gallbladder wall edema identified. No significant biliary dilatation. Pancreas: No mass, inflammatory changes, or other parenchymal abnormality identified. Spleen:  Within normal limits in size and appearance. Adrenals/Urinary Tract: No masses identified. No evidence of hydronephrosis. Stomach/Bowel: Visualized portions within the abdomen are unremarkable. Vascular/Lymphatic: Normal appearance of the abdominal aorta. No enlarged upper abdominal lymph nodes. Other:  There is ascites noted within the upper  abdomen. Musculoskeletal: No suspicious bone lesions identified. IMPRESSION: 1. Morphologic features of liver consistent with cirrhosis. 2. Study confirms presence of a 2.7 cm indeterminate mass within segment 7 of the liver. Unfortunately, there is marked respiratory motion artifact which significantly diminishes postcontrast imaging detail precluding more definitive assessment of this lesion and in particular evaluation of the enhancement characteristics of this structure. Further evaluation with liver protocol, contrast enhanced CT of the abdomen is advise. The should be less susceptible to motion artifact in this patient who was unable to breath hold for MRI. 3. Ascites 4. Bilateral pleural effusions. Electronically Signed   By: Signa Kell M.D.   On: 05/13/2017 09:34   US Abdomen Complete  Result Date: 05/12/2017 CLINICAL DATA:  Hepatic cirrhosis EXAM: ABDOMEN ULTRASOUND COMPLETE COMPARISON:  None in PACs FINDINGS: Gallbladder: The gallbladder is adequately distended. There is mild wall thickening to 4 mm. There is no pericholecystic fluid or positive sonographic Murphy's sign. No stones or sludge are observed. Common bile duct: Diameter: 3 mm Liver: The hepatic echotexture is increased. In the right hepatic lobe there is a hyperechoic solid-appearing focus measuring 2.7 x 2.3 x 2.5 cm. There is no intrahepatic ductal dilation. There is a small amount of ascites adjacent to the liver. Portal vein is patent on color Doppler imaging with normal direction of blood flow towards the liver. IVC: No abnormality visualized. Pancreas: The pancreas was obscured by bowel gas. Spleen: The pancreas is small measuring 3.4 cm in length. Right Kidney: Length: 10.5 cm. Echogenicity within normal limits. No mass or hydronephrosis visualized. Left Kidney: Length: 10.9 cm. Echogenicity within normal limits. No mass or hydronephrosis visualized. Abdominal aorta: Limited visualization due to bowel gas. The maximal measured  diameter is seen proximally 2.6 cm. There is a small amount of ascites. There is a right pleural effusion. Other findings: None. IMPRESSION: Mild gallbladder wall thickening may be related to a small amount of ascites noted adjacent to the liver. No definite pericholecystic fluid is currently visible. No gallstones or sonographic evidence of acute cholecystitis. Increased hepatic echotexture most compatible with fatty infiltrative change or the patient's known cirrhosis. A hyperechoic focus in the right hepatic lobe is most compatible with a hemangioma. However, given the history of cirrhosis, hepatic protocol MRI is recommended. Small amount of ascites adjacent to the liver.  There is a right pleural effusion. Electronically Signed   By: David  [place] M.D.   On: 05/12/2017 13:06   US Venous Img Lower Bilateral  Result Date: 05/12/2017 CLINICAL DATA:  History of pulmonary embolism. History of smoking. Evaluate for DVT. EXAM: BILATERAL LOWER EXTREMITY VENOUS DOPPLER ULTRASOUND TECHNIQUE: Gray-scale sonography with graded compression, as well as color Doppler and duplex ultrasound were performed to evaluate the lower extremity deep venous systems from the level of the common femoral vein and including the common femoral, femoral, profunda femoral, popliteal and calf veins including the posterior tibial, peroneal and gastrocnemius veins when visible. The superficial great saphenous vein was also interrogated. Spectral Doppler was utilized to evaluate flow at rest and with distal augmentation maneuvers in the common femoral, femoral and popliteal veins. COMPARISON:  Chest CTA - 05/25/2017 FINDINGS: RIGHT LOWER EXTREMITY Common Femoral Vein: No evidence of thrombus. Normal compressibility, respiratory phasicity and response to augmentation. Saphenofemoral Junction: No evidence of thrombus. Normal compressibility and flow on color Doppler imaging. Profunda Femoral Vein: No evidence of thrombus. Normal compressibility  and flow on color Doppler imaging. Femoral Vein: No evidence of thrombus. Normal compressibility, respiratory phasicity and response to augmentation. Popliteal Vein: No evidence of thrombus. Normal compressibility, respiratory phasicity and response to augmentation. Calf Veins: No evidence of thrombus. Normal compressibility and flow on color Doppler imaging. Superficial Great Saphenous Vein: No evidence of thrombus. Normal compressibility. Venous Reflux:  None. Other Findings:  None. LEFT LOWER EXTREMITY Common Femoral Vein: No evidence of thrombus. Normal compressibility, respiratory phasicity and response to augmentation. Saphenofemoral Junction: No evidence of thrombus. Normal compressibility and flow on color Doppler imaging. Profunda Femoral Vein: No evidence of thrombus. Normal compressibility and flow on color Doppler imaging. Femoral Vein: No evidence of thrombus. Normal compressibility, respiratory phasicity and response to augmentation. Popliteal Vein: No evidence of thrombus. Normal compressibility, respiratory phasicity and response to augmentation. Calf Veins: No evidence of thrombus. Normal compressibility and flow on color Doppler imaging. Superficial Great Saphenous Vein: No evidence of thrombus. Normal compressibility. Venous Reflux:  None. Other Findings:  None. IMPRESSION: No evidence of DVT within either lower extremity. Electronically Signed   By: Simonne Come M.D.   On: 05/12/2017 09:59    ECG & Cardiac Imaging    Atrial flutter, 132, 2:1 conduction, ant infarct;  Tele - afib/flutter, variable conduction - rates 30's to 40's this AM.  Assessment & Plan    1.  Symptomatic bradycardia with hypotension: Called to see the patient for newly diagnosed cardiomyopathy and atrial fibrillation/flutter and found him to be bradycardic and hypotensive.  He did receive carvedilol and diltiazem orally this morning.  Up until this morning, he is also been receiving IV diuresis.  I discontinue  carvedilol diltiazem and initially gave him 0.5 mg of atropine in the setting of bradycardia and hypotension with only minimal improvement in heart rate.  Dopamine started and patient transferred to ICU for closer observation while beta-blocker and calcium channel blocker, out of his system.  With cool extremities, I do have concern about cardiogenic shock and potentially low output heart failure and he may require inotropic therapy in the near future.  2.  Acute systolic CHF/Cardiogenic shock: Patient presented with rapid A. fib/flutter was found to have LV dysfunction and some evidence of volume overload.  He continues have JVD and trace lower extremity swelling.  Pressure support as above.  May require inotropic support as well.  Diuretics on hold in the setting of acute renal failure beginning  this morning.  At some point will require ischemic evaluation but in the setting of acute PE, this will be deferred.  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  3.  RLL PE: Anticoagulation per internal medicine.  Question role in current hemodynamic instability.  4.  Coagulopathy:   INR rose to 11.14 this morning.  This is not likely to be reflective of Eliquis.  LFTs also markedly abnormal.  I do not think all of this is explained by low output heart failure as EF is 30-35%.  GI following.  5.  Hepatic Cirrhosis: GI following.  See #4.  6.  Acute renal failure: In setting of CT w/ contrast, MRI with contrast, diuresis, and now bradycardia and hypotension/possibly some degree of cardiogenic shock.  Pressure support as above.  7.  Alcohol abuse: He says he only drinks 20 ounces of beer a day.  Cessation advised in the setting of cardiomyopathy.  8.  Tobacco abuse: Cessation advised.  Signed, Nicolasa Ducking, NP 05/13/2017, 1:21 PM  For questions or updates, please contact   Please consult [email or web address] for contact info under Cardiology/STEMI.

## 2017-05-13 NOTE — Progress Notes (Signed)
CH responded to code blue IC 1. [facility].    05/13/17 1537  Clinical Encounter Type  Visited With Patient not available;Health care provider  Visit Type Initial;Spiritual support;Code  Referral From Nurse  Consult/Referral To Chaplain

## 2017-05-13 NOTE — Progress Notes (Signed)
CH responded to a Code Blue for room IC-01. Medical team is performing CPR on my arrival. Sister is in the family waiting area. CH escorted MD to inform sister of the situation. Pt is stable at this time but the MD is not hopeful. CH provided the ministry of presence and prayer. CH is available for follow up as needed.    05/13/17 1600  Clinical Encounter Type  Visited With Patient;Patient and family together;Health care provider  Visit Type Initial;Spiritual support;Code;Critical Care  Referral From Nurse  Consult/Referral To Chaplain  Spiritual Encounters  Spiritual Needs Prayer;Emotional;Grief support

## 2017-05-13 NOTE — Progress Notes (Addendum)
[employer] [facility] [place] [facility]   PATIENT NAME: Ryan Banks    MR#:  [id number]  DATE OF BIRTH:  02/09/1951  SUBJECTIVE:   Patient here due to shortness of breath and noted to have an acute pulmonary embolism, also noted to be in acute kidney injury and also with CT scan/ultrasound findings suggestive of liver cirrhosis.  The patient complains of stomach sickness. HR down to 30's, Hypotension at 85/50. REVIEW OF SYSTEMS:    Review of Systems  Constitutional: Positive for malaise/fatigue. Negative for chills and fever.  HENT: Negative for congestion and tinnitus.   Eyes: Negative for blurred vision and double vision.  Respiratory: Positive for shortness of breath. Negative for cough and wheezing.   Cardiovascular: Negative for chest pain, orthopnea and PND.  Gastrointestinal: Negative for abdominal pain, diarrhea, nausea and vomiting.  Genitourinary: Negative for dysuria and hematuria.  Musculoskeletal: Negative for back pain.  Skin: Negative for rash.  Neurological: Positive for weakness. Negative for dizziness, sensory change and focal weakness.  Psychiatric/Behavioral: Negative for depression. The patient is not nervous/anxious.   All other systems reviewed and are negative.   Nutrition: Heart healthy Tolerating Diet: Yes Tolerating PT: Await Eval.    DRUG ALLERGIES:   Allergies  Allergen Reactions  . Penicillins Rash    Has patient had a PCN reaction causing immediate rash, facial/tongue/throat swelling, SOB or lightheadedness with hypotension: Yes Has patient had a PCN reaction causing severe rash involving mucus membranes or skin necrosis: No Has patient had a PCN reaction that required hospitalization: No Has patient had a PCN reaction occurring within the last 10 years: No If all of the above answers are "NO", then may proceed with Cephalosporin use.     VITALS:  Blood pressure (!) 87/62, pulse (!) 39, temperature (!) 94.2 F (34.6 C),  temperature source Axillary, resp. rate 18, height 5\' 5"  (1.651 m), weight 186 lb 14.4 oz (84.8 kg), SpO2 100 %.  PHYSICAL EXAMINATION:   Physical Exam  GENERAL:  66 y.o.-year-old patient lying in bed in mild Resp. Distress.  EYES: Pupils equal, round, reactive to light and accommodation. No scleral icterus. Extraocular muscles intact.  HEENT: Head atraumatic, normocephalic. Oropharynx and nasopharynx clear.  NECK:  Supple, no jugular venous distention. No thyroid enlargement, no tenderness.  LUNGS: Good a/e b/l, no wheezing, bibasilar mild rales, no rhonchi. No use of accessory muscles of respiration.  CARDIOVASCULAR: S1, S2 normal. No murmurs, rubs, or gallops.  ABDOMEN: Soft, nontender, nondistended. Bowel sounds present. No organomegaly or mass.  EXTREMITIES: No cyanosis, clubbing or edema.    NEUROLOGIC: Cranial nerves II through XII are intact. No focal Motor or sensory deficits b/l.   PSYCHIATRIC: The patient is alert and oriented x 3.  SKIN: No obvious rash, lesion, or ulcer.    LABORATORY PANEL:   CBC Recent Labs  Lab 05/13/17 0511  WBC 8.5  HGB 13.4  HCT 40.4  PLT 83*   ------------------------------------------------------------------------------------------------------------------  Chemistries  Recent Labs  Lab 12-04-16 2244  05/13/17 0445 05/13/17 0511  NA  --    < >  --  127*  K  --    < >  --  4.8  CL  --    < >  --  96*  CO2  --    < >  --  21*  GLUCOSE  --    < >  --  129*  BUN  --    < >  --  61*  CREATININE  --    < >  --  2.79*  CALCIUM  --    < >  --  8.4*  MG 2.0  --   --   --   AST  --   --  1,907*  --   ALT  --   --  928*  --   ALKPHOS  --   --  61  --   BILITOT  --   --  6.0*  --    < > = values in this interval not displayed.   ------------------------------------------------------------------------------------------------------------------  Cardiac Enzymes Recent Labs  Lab 07/03/17 2244  TROPONINI 0.06*    ------------------------------------------------------------------------------------------------------------------  RADIOLOGY:  Mr Abdomen W Wo Contrast  Result Date: 05/13/2017 CLINICAL DATA:  Known cirrhosis.  Evaluate suspected liver lesion. EXAM: MRI ABDOMEN WITHOUT AND WITH CONTRAST TECHNIQUE: Multiplanar multisequence MR imaging of the abdomen was performed both before and after the administration of intravenous contrast. CONTRAST:  17mL MULTIHANCE GADOBENATE DIMEGLUMINE 529 MG/ML IV SOLN COMPARISON:  Ultrasound 05/12/2017. FINDINGS: Lower chest: Moderate right pleural effusion and small left pleural effusion identified. The heart size is enlarged. Hepatobiliary: The liver appears cirrhotic. There is hypertrophy of the lateral segment of left lobe and caudate lobes of liver. The contour the liver is irregular/nodular. Within segment 7 of the liver there is a T2 hyperintense structure measuring 2.2 cm. The post-contrast T1 weighted images are markedly diminished secondary to motion artifact. This affects the immediate, 45 second delayed, 90 second delayed and 3 minutes delayed images. Gallbladder wall edema identified. No significant biliary dilatation. Pancreas: No mass, inflammatory changes, or other parenchymal abnormality identified. Spleen:  Within normal limits in size and appearance. Adrenals/Urinary Tract: No masses identified. No evidence of hydronephrosis. Stomach/Bowel: Visualized portions within the abdomen are unremarkable. Vascular/Lymphatic: Normal appearance of the abdominal aorta. No enlarged upper abdominal lymph nodes. Other:  There is ascites noted within the upper abdomen. Musculoskeletal: No suspicious bone lesions identified. IMPRESSION: 1. Morphologic features of liver consistent with cirrhosis. 2. Study confirms presence of a 2.7 cm indeterminate mass within segment 7 of the liver. Unfortunately, there is marked respiratory motion artifact which significantly diminishes  postcontrast imaging detail precluding more definitive assessment of this lesion and in particular evaluation of the enhancement characteristics of this structure. Further evaluation with liver protocol, contrast enhanced CT of the abdomen is advise. The should be less susceptible to motion artifact in this patient who was unable to breath hold for MRI. 3. Ascites 4. Bilateral pleural effusions. Electronically Signed   By: Signa Kellaylor  Stroud M.D.   On: 05/13/2017 09:34   [place] Abdomen Complete  Result Date: 05/12/2017 CLINICAL DATA:  Hepatic cirrhosis EXAM: ABDOMEN ULTRASOUND COMPLETE COMPARISON:  None in PACs FINDINGS: Gallbladder: The gallbladder is adequately distended. There is mild wall thickening to 4 mm. There is no pericholecystic fluid or positive sonographic Murphy's sign. No stones or sludge are observed. Common bile duct: Diameter: 3 mm Liver: The hepatic echotexture is increased. In the right hepatic lobe there is a hyperechoic solid-appearing focus measuring 2.7 x 2.3 x 2.5 cm. There is no intrahepatic ductal dilation. There is a small amount of ascites adjacent to the liver. Portal vein is patent on color Doppler imaging with normal direction of blood flow towards the liver. IVC: No abnormality visualized. Pancreas: The pancreas was obscured by bowel gas. Spleen: The pancreas is small measuring 3.4 cm in length. Right Kidney: Length: 10.5 cm. Echogenicity within normal limits. No mass or hydronephrosis visualized. Left  Kidney: Length: 10.9 cm. Echogenicity within normal limits. No mass or hydronephrosis visualized. Abdominal aorta: Limited visualization due to bowel gas. The maximal measured diameter is seen proximally 2.6 cm. There is a small amount of ascites. There is a right pleural effusion. Other findings: None. IMPRESSION: Mild gallbladder wall thickening may be related to a small amount of ascites noted adjacent to the liver. No definite pericholecystic fluid is currently visible. No gallstones  or sonographic evidence of acute cholecystitis. Increased hepatic echotexture most compatible with fatty infiltrative change or the patient's known cirrhosis. A hyperechoic focus in the right hepatic lobe is most compatible with a hemangioma. However, given the history of cirrhosis, hepatic protocol MRI is recommended. Small amount of ascites adjacent to the liver. There is a right pleural effusion. Electronically Signed   By: David  [place] M.D.   On: 05/12/2017 13:06   US Venous Img Lower Bilateral  Result Date: 05/12/2017 CLINICAL DATA:  History of pulmonary embolism. History of smoking. Evaluate for DVT. EXAM: BILATERAL LOWER EXTREMITY VENOUS DOPPLER ULTRASOUND TECHNIQUE: Gray-scale sonography with graded compression, as well as color Doppler and duplex ultrasound were performed to evaluate the lower extremity deep venous systems from the level of the common femoral vein and including the common femoral, femoral, profunda femoral, popliteal and calf veins including the posterior tibial, peroneal and gastrocnemius veins when visible. The superficial great saphenous vein was also interrogated. Spectral Doppler was utilized to evaluate flow at rest and with distal augmentation maneuvers in the common femoral, femoral and popliteal veins. COMPARISON:  Chest CTA - 06/08/2017 FINDINGS: RIGHT LOWER EXTREMITY Common Femoral Vein: No evidence of thrombus. Normal compressibility, respiratory phasicity and response to augmentation. Saphenofemoral Junction: No evidence of thrombus. Normal compressibility and flow on color Doppler imaging. Profunda Femoral Vein: No evidence of thrombus. Normal compressibility and flow on color Doppler imaging. Femoral Vein: No evidence of thrombus. Normal compressibility, respiratory phasicity and response to augmentation. Popliteal Vein: No evidence of thrombus. Normal compressibility, respiratory phasicity and response to augmentation. Calf Veins: No evidence of thrombus. Normal  compressibility and flow on color Doppler imaging. Superficial Great Saphenous Vein: No evidence of thrombus. Normal compressibility. Venous Reflux:  None. Other Findings:  None. LEFT LOWER EXTREMITY Common Femoral Vein: No evidence of thrombus. Normal compressibility, respiratory phasicity and response to augmentation. Saphenofemoral Junction: No evidence of thrombus. Normal compressibility and flow on color Doppler imaging. Profunda Femoral Vein: No evidence of thrombus. Normal compressibility and flow on color Doppler imaging. Femoral Vein: No evidence of thrombus. Normal compressibility, respiratory phasicity and response to augmentation. Popliteal Vein: No evidence of thrombus. Normal compressibility, respiratory phasicity and response to augmentation. Calf Veins: No evidence of thrombus. Normal compressibility and flow on color Doppler imaging. Superficial Great Saphenous Vein: No evidence of thrombus. Normal compressibility. Venous Reflux:  None. Other Findings:  None. IMPRESSION: No evidence of DVT within either lower extremity. Electronically Signed   By: Simonne Come M.D.   On: 05/12/2017 09:59     ASSESSMENT AND PLAN:   66 year old male with no significant past medical history presented to the hospital due to shortness of breath.  1. Acute respiratory failure with hypoxia-secondary to CHF and also pulmonary embolism. Hold IV Lasix due to renal failure,  follow I's and O's and daily weights. Switched from subcutaneous Lovenox to Eliquis for anticoagulation for the PE, wean off oxygen as tolerated.  2. Acute pulmonary embolism-this is a unprovoked pulmonary embolism. -Patient was on subcutaneous Lovenox, switched to Eliquis. Dopplers of lower extremity  negative for DVT.  3. 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 Echo Cardizem showing ejection fraction of 30-35%. He is on IV Lasix, carvedilol, Lisinopril.   Hold lasix and  lisinopril and spironolactone due to renal failure.  4. A. Fib with RVR- new onset in nature.  hold Coreg, Cardizem due to bradycardia. - cont. Eliquis.   5. Liver Cirrhosis with Abnormal LFTs The patient was incidentally noted to have liver cirrhosis on his CT chest. Abdominal ultrasound confirms that. Patient does have a history of alcohol abuse. No evidence of hepatic encephalopathy or ascites. Will need EGD for variceal screening,  AFP and Mri liver mass protocol to delineate liver lesion seen [place], also need colonoscopy for colon cancer screening as outpt per Dr. Allegra Lai.  6. History of alcohol abuse-continue CIWA protocol, continue thiamine, folate.  7. Thrombocytopenia-secondary to alcohol abuse and liver cirrhosis. Follow platelet count, no acute bleeding.  8. Acute kidney injury- Hold Lasix, lisinopril, follow-up BMP.  Elevated INR, possible due to Eliquis, no need to f/u INR daily per Dr. Allegra Lai.   Hypotension with bradycardia, cardiogenic shock. This patient is transferred to ICU and start dopamine drip.  Discussed with Dr. Mariah Milling and Dr. Allegra Lai.  Unresponsiveness due to above. CODE BLUE was called, CPR started and lasted about 30 minutes, pulses resumed just now. The patient received iv epinephrine, atropine etc. He is intubated by Dr. Sung Amabile. I discussed with the patient's sister about the patient's critical condition and very poor prognosis, he may die anytime.  She voiced understanding. I discussed with Dr. Sung Amabile, who is going to discuss with patient's sister.  All the records are reviewed and case discussed with Care Management/Social Worker. Management plans discussed with the patient, family and they are in agreement.  CODE STATUS: Full code  DVT Prophylaxis: Eliquis  TOTAL CRITICAL TIME TAKING CARE OF THIS PATIENT: 78 minutes.   POSSIBLE D/C IN ? DAYS, DEPENDING ON CLINICAL CONDITION.   Shaune Pollack M.D on 05/13/2017 at 3:19 PM  Between 7am to 6pm - Pager -  [phone]  After 6pm go to [email or web address] - Therapist, [facility]  Office  [phone]  CC: Primary care physician; Center, [facility]

## 2017-05-13 NOTE — Progress Notes (Signed)
Ryan Repressohini R Jahnyla Parrillo, MD [address]  [address]  [place], [place] [address]  Main: [phone]  Fax: [phone] Pager: [phone]   Ryan Banks is being followed for cirrhosis, Day 1 of follow up    Subjective: He had MRI last night. His Cr is rising. He reports feeling sick in his stomach this morning. He has breakfast bedside.  Reports SOB is better. He is on 2lit O2   Objective: Vital signs in last 24 hours: Vitals:   05/13/17 0348 05/13/17 0500 05/13/17 0734 05/13/17 0805  BP: 106/65  103/67   Pulse: 82  (!) 38 76  Resp:   18   Temp: 97.6 F (36.4 C)  98.6 F (37 C)   TempSrc: Oral     SpO2: 99%  100%   Weight:  186 lb 14.4 oz (84.8 kg)    Height:       Weight change: -1.6 oz (-3.221 kg)  Intake/Output Summary (Last 24 hours) at 05/13/2017 1137 Last data filed at 05/13/2017 0734 Gross per 24 hour  Intake 480 ml  Output 225 ml  Net 255 ml     Exam: Heart:: Regular rate and rhythm or S1S2 present Lungs: clear to auscultation Abdomen: soft, nontender, normal bowel sounds   Lab Results: @LABTEST2 @ Micro Results: No results found for this or any previous visit (from the past 240 hour(s)). Studies/Results: Dg Chest 2 View  Result Date: 05/19/2017 CLINICAL DATA:  Three weeks of shortness of breath with productive cough. Current smoker. EXAM: CHEST  2 VIEW COMPARISON:  None in PACs FINDINGS: The left lung is well-expanded. On the right there is mild volume loss at the base with small effusion and probable infiltrate in the lower lobe. The cardiac silhouette is enlarged. The pulmonary vascularity is engorged centrally with mild cephalization. There is calcification in the wall of the aortic arch. IMPRESSION: Right lower lobe atelectasis or pneumonia with small right pleural effusion. Followup PA and lateral chest X-ray is recommended in 3-4 weeks following trial of antibiotic therapy to ensure resolution and exclude underlying malignancy. Mild cardiomegaly  and pulmonary vascular congestion consistent with low-grade CHF. Thoracic aortic atherosclerosis. Electronically Signed   By: David  SwazilandJordan M.D.   On: 2016-12-26 12:39   Ct Angio Chest Pe W And/or Wo Contrast  Result Date: 05/09/2017 CLINICAL DATA:  Shortness of breath with exertion for 3 weeks. Symptoms are worse while lying flat. EXAM: CT ANGIOGRAPHY CHEST WITH CONTRAST TECHNIQUE: Multidetector CT imaging of the chest was performed using the standard protocol during bolus administration of intravenous contrast. Multiplanar CT image reconstructions and MIPs were obtained to evaluate the vascular anatomy. CONTRAST:  75 mL Isovue 370 COMPARISON:  Chest radiograph 2016-12-26 FINDINGS: Cardiovascular: Contrast injection is sufficient to demonstrate satisfactory opacification of the pulmonary arteries to the segmental level. There is a filling defect within the right lower lobe pulmonary artery distribution, extending into the posterior and lateral basal segmental arteries. The main pulmonary artery is within normal limits for size. There is no CT evidence of acute right heart strain. There is calcific aortic atherosclerosis and multifocal coronary artery calcification. There is a normal 3-vessel arch branching pattern. Heart size is mildly enlarged, without pericardial effusion. Mediastinum/Nodes: No mediastinal, hilar or axillary lymphadenopathy. The visualized thyroid and thoracic esophageal course are unremarkable. Lungs/Pleura: There is a medium-sized right pleural effusion with associated atelectasis. The airways are patent. No pulmonary nodules or masses. No pneumothorax. Upper Abdomen: Contrast bolus timing is not optimized for evaluation of the  abdominal organs. Small volume right upper quadrant ascites. There is relative hypertrophy of the left hepatic lobe with a mildly nodular liver contour. There is also a small amount of fluid in the left upper quadrant. The spleen is small. Normal pancreas. Visualized  portions of the kidneys are normal. Normal adrenal glands. Musculoskeletal: No chest wall abnormality. No acute or significant osseous findings. Review of the MIP images confirms the above findings. IMPRESSION: 1. Right lower lobe pulmonary embolus involving the lateral and posterior basal segmental arteries. No right heart strain by CT. 2. Medium-sized right pleural effusion with associated atelectasis. 3. A small volume of right and left upper quadrant abdominal ascites with relative left hepatic lobe hypertrophy and mildly nodular hepatic contour that may indicate underlying hepatic cirrhosis. Nonemergent liver ultrasound or abdominal MRI might be helpful for further characterization. 4. 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 Critical Value/emergent results were called by telephone at the time of interpretation on 05/10/2017 at 1:43 pm to Dr. Nita SickleAROLINA VERONESE , who verbally acknowledged these results. Electronically Signed   By: Deatra RobinsonKevin  Herman M.D.   On: 05/10/2017 13:48   Mr Abdomen W ZOWo Contrast  Result Date: 05/13/2017 CLINICAL DATA:  Known cirrhosis.  Evaluate suspected liver lesion. EXAM: MRI ABDOMEN WITHOUT AND WITH CONTRAST TECHNIQUE: Multiplanar multisequence MR imaging of the abdomen was performed both before and after the administration of intravenous contrast. CONTRAST:  17mL MULTIHANCE GADOBENATE DIMEGLUMINE 529 MG/ML IV SOLN COMPARISON:  Ultrasound 05/12/2017. FINDINGS: Lower chest: Moderate right pleural effusion and small left pleural effusion identified. The heart size is enlarged. Hepatobiliary: The liver appears cirrhotic. There is hypertrophy of the lateral segment of left lobe and caudate lobes of liver. The contour the liver is irregular/nodular. Within segment 7 of the liver there is a T2 hyperintense structure measuring 2.2 cm. The post-contrast T1 weighted images are markedly diminished secondary to motion artifact. This affects the immediate, 45 second delayed, 90  second delayed and 3 minutes delayed images. Gallbladder wall edema identified. No significant biliary dilatation. Pancreas: No mass, inflammatory changes, or other parenchymal abnormality identified. Spleen:  Within normal limits in size and appearance. Adrenals/Urinary Tract: No masses identified. No evidence of hydronephrosis. Stomach/Bowel: Visualized portions within the abdomen are unremarkable. Vascular/Lymphatic: Normal appearance of the abdominal aorta. No enlarged upper abdominal lymph nodes. Other:  There is ascites noted within the upper abdomen. Musculoskeletal: No suspicious bone lesions identified. IMPRESSION: 1. Morphologic features of liver consistent with cirrhosis. 2. Study confirms presence of a 2.7 cm indeterminate mass within segment 7 of the liver. Unfortunately, there is marked respiratory motion artifact which significantly diminishes postcontrast imaging detail precluding more definitive assessment of this lesion and in particular evaluation of the enhancement characteristics of this structure. Further evaluation with liver protocol, contrast enhanced CT of the abdomen is advise. The should be less susceptible to motion artifact in this patient who was unable to breath hold for MRI. 3. Ascites 4. Bilateral pleural effusions. Electronically Signed   By: Signa Kellaylor  Stroud M.D.   On: 05/13/2017 09:34   [place] Abdomen Complete  Result Date: 05/12/2017 CLINICAL DATA:  Hepatic cirrhosis EXAM: ABDOMEN ULTRASOUND COMPLETE COMPARISON:  None in PACs FINDINGS: Gallbladder: The gallbladder is adequately distended. There is mild wall thickening to 4 mm. There is no pericholecystic fluid or positive sonographic Murphy's sign. No stones or sludge are observed. Common bile duct: Diameter: 3 mm Liver: The hepatic echotexture is increased. In the right hepatic lobe there is a hyperechoic solid-appearing focus measuring 2.7 x  2.3 x 2.5 cm. There is no intrahepatic ductal dilation. There is a small amount of  ascites adjacent to the liver. Portal vein is patent on color Doppler imaging with normal direction of blood flow towards the liver. IVC: No abnormality visualized. Pancreas: The pancreas was obscured by bowel gas. Spleen: The pancreas is small measuring 3.4 cm in length. Right Kidney: Length: 10.5 cm. Echogenicity within normal limits. No mass or hydronephrosis visualized. Left Kidney: Length: 10.9 cm. Echogenicity within normal limits. No mass or hydronephrosis visualized. Abdominal aorta: Limited visualization due to bowel gas. The maximal measured diameter is seen proximally 2.6 cm. There is a small amount of ascites. There is a right pleural effusion. Other findings: None. IMPRESSION: Mild gallbladder wall thickening may be related to a small amount of ascites noted adjacent to the liver. No definite pericholecystic fluid is currently visible. No gallstones or sonographic evidence of acute cholecystitis. Increased hepatic echotexture most compatible with fatty infiltrative change or the patient's known cirrhosis. A hyperechoic focus in the right hepatic lobe is most compatible with a hemangioma. However, given the history of cirrhosis, hepatic protocol MRI is recommended. Small amount of ascites adjacent to the liver. There is a right pleural effusion. Electronically Signed   By: David  Swaziland M.D.   On: 05/12/2017 13:06   US Venous Img Lower Bilateral  Result Date: 05/12/2017 CLINICAL DATA:  History of pulmonary embolism. History of smoking. Evaluate for DVT. EXAM: BILATERAL LOWER EXTREMITY VENOUS DOPPLER ULTRASOUND TECHNIQUE: Gray-scale sonography with graded compression, as well as color Doppler and duplex ultrasound were performed to evaluate the lower extremity deep venous systems from the level of the common femoral vein and including the common femoral, femoral, profunda femoral, popliteal and calf veins including the posterior tibial, peroneal and gastrocnemius veins when visible. The superficial  great saphenous vein was also interrogated. Spectral Doppler was utilized to evaluate flow at rest and with distal augmentation maneuvers in the common femoral, femoral and popliteal veins. COMPARISON:  Chest CTA - 05/31/2017 FINDINGS: RIGHT LOWER EXTREMITY Common Femoral Vein: No evidence of thrombus. Normal compressibility, respiratory phasicity and response to augmentation. Saphenofemoral Junction: No evidence of thrombus. Normal compressibility and flow on color Doppler imaging. Profunda Femoral Vein: No evidence of thrombus. Normal compressibility and flow on color Doppler imaging. Femoral Vein: No evidence of thrombus. Normal compressibility, respiratory phasicity and response to augmentation. Popliteal Vein: No evidence of thrombus. Normal compressibility, respiratory phasicity and response to augmentation. Calf Veins: No evidence of thrombus. Normal compressibility and flow on color Doppler imaging. Superficial Great Saphenous Vein: No evidence of thrombus. Normal compressibility. Venous Reflux:  None. Other Findings:  None. LEFT LOWER EXTREMITY Common Femoral Vein: No evidence of thrombus. Normal compressibility, respiratory phasicity and response to augmentation. Saphenofemoral Junction: No evidence of thrombus. Normal compressibility and flow on color Doppler imaging. Profunda Femoral Vein: No evidence of thrombus. Normal compressibility and flow on color Doppler imaging. Femoral Vein: No evidence of thrombus. Normal compressibility, respiratory phasicity and response to augmentation. Popliteal Vein: No evidence of thrombus. Normal compressibility, respiratory phasicity and response to augmentation. Calf Veins: No evidence of thrombus. Normal compressibility and flow on color Doppler imaging. Superficial Great Saphenous Vein: No evidence of thrombus. Normal compressibility. Venous Reflux:  None. Other Findings:  None. IMPRESSION: No evidence of DVT within either lower extremity. Electronically Signed    By: Simonne Come M.D.   On: 05/12/2017 09:59   Medications: I have reviewed the patient's current medications. Scheduled Meds: . apixaban  10  mg Oral BID   Followed by  . [START ON 05/19/2017] apixaban  5 mg Oral BID  . aspirin EC  81 mg Oral Daily  . carvedilol  3.125 mg Oral BID WC  . diltiazem  60 mg Oral Q8H  . folic acid  1 mg Oral Daily  . multivitamin with minerals  1 tablet Oral Daily  . pantoprazole  40 mg Oral Daily  . sodium chloride flush  3 mL Intravenous Q12H  . thiamine  100 mg Oral Daily   Or  . thiamine  100 mg Intravenous Daily   Continuous Infusions: . sodium chloride     PRN Meds:.sodium chloride, acetaminophen **OR** acetaminophen, albuterol, alum & mag hydroxide-simeth, bisacodyl, LORazepam **OR** LORazepam, ondansetron **OR** ondansetron (ZOFRAN) IV, oxyCODONE, senna-docusate, sodium chloride flush   Assessment: Active Problems:   Pulmonary embolism (HCC)  PADEN SENGER is a 66 y.o. male with h/o ETOH and tobacco use, exertional SOB, swelling of legs, new diagnosis of PE, cardiomyopathy, new onset of A Fib on eliquis, also with abnormal LFTs, probable cirrhosis, and liver lesion s/p MRI liver. 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 However, need to rule out secondary causes, ordered labs He probably has cirrhosis based on imaging, thrombocytopenia and ascites. Work up for secondary causes as above Monitor LFTs daily Diuretics discontinued secondary to AKI 2gm sodium diet Will need EGD for variceal screening as outpt. There is no evidence of recent or active GI bleed He does not have PSE  INR and PT are falsely elevated secondary to eliquis, currently dose reduced due to renal impairment Do not recommend monitoring PT/INR for antithrombotic agents - Liver lesion: AFP in process and MRI liver mass protocol was inadequate study due to motion artifact. Will  not recommend triple phase CT at this time in the setting of AKI. Recommend close follow up as out pt, may even need to consider image guided biopsy of the liver lesion to r/o [facility] He will also need colonoscopy for colon cancer screening as he is over due for his age which can be addressed as outpt  Thank you for involving me in the care of this patient. We will follow      LOS: 2 days   Carlynn Leduc 05/13/2017, 11:37 AM

## 2017-05-13 NOTE — Procedures (Signed)
Oral Intubation Procedure Note  Indications: Respiratory insufficiency Consent: Unable to obtain consent because of cardiac arrest   Pre-meds:   Neuromuscular blockade: Rocuronium 50 mg IV  Laryngoscope: #4 MAC  Visualization: cords fully visualized  ETT: 8.0 ETT passed on first attempt and secured @ 24 cm at upper incisors  Findings: Very deep larynx   Evaluation:  Tube position confirmed by auscultation and EZCap CXR pending  Pt tolerated procedure well without complications   Merton Border, MD PCCM service Mobile [phone] Pager [phone] 05/13/2017

## 2017-05-13 NOTE — Progress Notes (Signed)
Paged Dr.Chen, no response yet.

## 2017-05-13 NOTE — Progress Notes (Signed)
Critical INR reported to Dr. Sheryle Hailiamond, redraw order placed to verify results.

## 2017-05-13 NOTE — Procedures (Signed)
Central Venous Catheter Insertion Procedure Note French AnaSamuel W Kho [id number] 10/22/1950  Procedure: Insertion of Central Venous Catheter Indications: Assessment of intravascular volume, Drug and/or fluid administration and Frequent blood sampling  Procedure Details Consent: Risks of procedure as well as the alternatives and risks of each were explained to the (patient/caregiver).  Consent for procedure obtained. Time Out: Verified patient identification, verified procedure, site/side was marked, verified correct patient position, special equipment/implants available, medications/allergies/relevent history reviewed, required imaging and test results available.  Performed  Maximum sterile technique was used including antiseptics, cap, gloves, gown, hand hygiene, mask and sheet. Skin prep: Chlorhexidine; local anesthetic administered A antimicrobial bonded/coated triple lumen catheter was placed in the right internal jugular vein using the Seldinger technique.  Evaluation Blood flow good Complications: No apparent complications Patient did tolerate procedure well. Chest X-ray ordered to verify placement.  CXR: pending.  Right Internal Jugular Central line placed utilizing ultrasound no complications noted during or following procedure chest xray pending  Sonda Rumbleana Beverlie Kurihara, AGNP  Pulmonary/Critical Care Pager [phone] (please enter [phone]) PCCM Consult Pager [phone] (please enter 7 digits)

## 2017-05-13 NOTE — Progress Notes (Addendum)
Notified Ward GivensChris Berge about patients heart rate dipping into the 30's patient states that he does not feel well can't describe how he feels BP 85/50. No orders received stated that he will go and see patient.  Will continue to monitor.

## 2017-05-13 NOTE — Procedures (Signed)
ACLS/CPR NOTE  We were called emergently to bedside for bradycardic PEA arrest. Under my supervision, ACLS was administered for approx 15 mins including multiple rounds of epinephrine, 2 or 3 amps atropine, initiation of dopamine and norepinephrine infusions to maximum doses.   He presently has a pulse restored but it is very weak. I have discussed with pt's HCPOA (sister) and explained that there is a very high likelihood or recurrent cardiac arrest in the very near future. We agree that no further ACLS is warranted given very poor prognosis for functional recovery   Billy Fischeravid Maurina Fawaz, MD PCCM service Mobile [phone] Pager [phone] 05/13/2017 4:05 PM

## 2017-05-13 NOTE — Consult Note (Signed)
Name: Ryan Banks MRN: [id number] DOB: 1950/12/28    ADMISSION DATE:  05/13/2017 CONSULTATION DATE: 05/13/2017  REFERRING MD : Dr. Imogene Burn   CHIEF COMPLAINT: Symptomatic Bradycardia   BRIEF PATIENT DESCRIPTION:  66 yo male admitted to telemetry unit 11/5 due to afibb with RVR, acute respiratory failure secondary to right sided PE and CHF exacerbation.  He developed symptomatic bradycardia and hypotension on 11/7 requiring transfer to ICU for dopamine gtt   SIGNIFICANT EVENTS  11/5-Pt admitted to telemetry unit  11/7-Pt transferred to ICU   STUDIES:  CT Angio Chest PE 11/5>>Right lower lobe pulmonary embolus involving the lateral and posterior basal segmental arteries. No right heart strain by CT. Medium-sized right pleural effusion with associated atelectasis. A small volume of right and left upper quadrant abdominal ascites with relative left hepatic lobe hypertrophy and mildly nodular hepatic contour that may indicate underlying hepatic cirrhosis.Nonemergent liver ultrasound or abdominal MRI might be helpful for further characterization. Coronary artery and aortic atherosclerosis MR Abdomen 11/6>>Morphologic features of liver consistent with cirrhosis. Study confirms presence of a 2.7 cm indeterminate mass within segment 7 of the liver. Unfortunately, there is marked respiratory motion artifact which significantly diminishes postcontrast imaging detail precluding more definitive assessment of this lesion and in particular evaluation of the enhancement characteristics of this structure. Further evaluation with liver protocol, contrast enhanced CT of the abdomen is advise. The should be less susceptible to motion artifact in this patient who was unable to breath hold for MRI. Ascites Bilateral pleural effusions. US Venous Lower Bilat 11/6>>No evidence of DVT within either lower extremity. [place] Abd 11/6>>Mild gallbladder wall thickening may be related to a small amount of ascites noted adjacent to  the liver. No definite pericholecystic fluid is currently visible. No gallstones or sonographic evidence of acute cholecystitis. Increased hepatic echotexture most compatible with fatty infiltrative change or the patient's known cirrhosis. A hyperechoic focus in the right hepatic lobe is most compatible with a hemangioma. However, given the history of cirrhosis, hepatic protocol MRI is recommended. Small amount of ascites adjacent to the liver. There is a right pleural effusion. Echo 11/6>>EF 30%-35%   HISTORY OF PRESENT ILLNESS:   This is a 66 yo male with a PMH of Tobacco Abuse and ETOH abuse.  He presented to [facility] ER 11/1 with shortness of breath onset of symptoms 3 weeks ago and has progressively worsened.  He also endorsed new onset bilateral lower extremity edema.  In the ER CXR concerning for low grade CHF with questionable RLL pneumonia and CT Angio revealing right sided PE.  He was also found to have ascites and MR Abd results concerning for cirrhosis. He was initially started on subq lovenox admitted to the telemetry unit by hospitalist team for further workup and treatment. On 11/7 pt developed symptomatic bradycardia with hypotension requiring transfer to ICU PCCM consulted.    PAST MEDICAL HISTORY :   has a past medical history of ETOH abuse and Tobacco abuse.  has no past surgical history on file. Prior to Admission medications   Not on File   Allergies  Allergen Reactions  . Penicillins Rash    Has patient had a PCN reaction causing immediate rash, facial/tongue/throat swelling, SOB or lightheadedness with hypotension: Yes Has patient had a PCN reaction causing severe rash involving mucus membranes or skin necrosis: No Has patient had a PCN reaction that required hospitalization: No Has patient had a PCN reaction occurring within the last 10 years: No If all of the above  answers are "NO", then may proceed with Cephalosporin use.     FAMILY HISTORY:  family history is not on  file. SOCIAL HISTORY:  reports that he has been smoking cigars.  he has never used smokeless tobacco. He reports that he drinks alcohol. He reports that he does not use drugs.  REVIEW OF SYSTEMS: Positives in BOLD  Constitutional: Negative for fever, chills, weight loss, malaise/fatigue and diaphoresis.  HENT: Negative for hearing loss, ear pain, nosebleeds, congestion, sore throat, neck pain, tinnitus and ear discharge.   Eyes: Negative for blurred vision, double vision, photophobia, pain, discharge and redness.  Respiratory: cough, hemoptysis, sputum production, shortness of breath with exertion, wheezing and stridor.   Cardiovascular: Negative for chest pain, palpitations, orthopnea, claudication, leg swelling and PND.  Gastrointestinal: Negative for heartburn, nausea, vomiting, abdominal pain, diarrhea, constipation, blood in stool and melena.  Genitourinary: Negative for dysuria, urgency, frequency, hematuria and flank pain.  Musculoskeletal: Negative for myalgias, back pain, joint pain and falls.  Skin: Negative for itching and rash.  Neurological: Negative for dizziness, tingling, tremors, sensory change, speech change, focal weakness, seizures, loss of consciousness, weakness and headaches.  Endo/Heme/Allergies: Negative for environmental allergies and polydipsia. Does not bruise/bleed easily.  SUBJECTIVE:  States he feels slightly short of breath due to exertion.  VITAL SIGNS: Temp:  [97.4 F (36.3 C)-98.6 F (37 C)] 98.6 F (37 C) (11/07 0734) Pulse Rate:  [38-82] 39 (11/07 1152) Resp:  [15-18] 18 (11/07 0734) BP: (79-117)/(32-67) 85/50 (11/07 1200) SpO2:  [92 %-100 %] 100 % (11/07 1152) Weight:  [84.8 kg (186 lb 14.4 oz)] 84.8 kg (186 lb 14.4 oz) (11/07 0500)  PHYSICAL EXAMINATION: General: well developed, well nourished male, NAD  Neuro: alert and oriented, follows commands HEENT: sclera icterus , no JVD Cardiovascular: sinus brady, s1s2, no M/R/G Lungs: diminished all  right lobes, clear throughout left lobes, slightly tachypneic and labored  Abdomen: +BS x4, soft, non tender, mildly distended  Musculoskeletal: normal bulk and tone, no edema  Skin: intact no rashes or lesions   Recent Labs  Lab 06/01/2017 1211 05/12/17 0445 05/13/17 0511  NA 132* 139 127*  K 4.4 3.7 4.8  CL 99* 105 96*  CO2 21* 27 21*  BUN 35* 17 61*  CREATININE 1.56* 1.01 2.79*  GLUCOSE 180* 157* 129*   Recent Labs  Lab 05/17/2017 1211 05/12/17 0602 05/13/17 0511  HGB 15.9 15.0 13.4  HCT 47.9 45.5 40.4  WBC 7.9 8.3 8.5  PLT 99* 95* 83*   Ct Angio Chest Pe W And/or Wo Contrast  Result Date: 05/10/2017 CLINICAL DATA:  Shortness of breath with exertion for 3 weeks. Symptoms are worse while lying flat. EXAM: CT ANGIOGRAPHY CHEST WITH CONTRAST TECHNIQUE: Multidetector CT imaging of the chest was performed using the standard protocol during bolus administration of intravenous contrast. Multiplanar CT image reconstructions and MIPs were obtained to evaluate the vascular anatomy. CONTRAST:  75 mL Isovue 370 COMPARISON:  Chest radiograph 05/31/2017 FINDINGS: Cardiovascular: Contrast injection is sufficient to demonstrate satisfactory opacification of the pulmonary arteries to the segmental level. There is a filling defect within the right lower lobe pulmonary artery distribution, extending into the posterior and lateral basal segmental arteries. The main pulmonary artery is within normal limits for size. There is no CT evidence of acute right heart strain. There is calcific aortic atherosclerosis and multifocal coronary artery calcification. There is a normal 3-vessel arch branching pattern. Heart size is mildly enlarged, without pericardial effusion. Mediastinum/Nodes: No mediastinal, hilar or  axillary lymphadenopathy. The visualized thyroid and thoracic esophageal course are unremarkable. Lungs/Pleura: There is a medium-sized right pleural effusion with associated atelectasis. The airways are  patent. No pulmonary nodules or masses. No pneumothorax. Upper Abdomen: Contrast bolus timing is not optimized for evaluation of the abdominal organs. Small volume right upper quadrant ascites. There is relative hypertrophy of the left hepatic lobe with a mildly nodular liver contour. There is also a small amount of fluid in the left upper quadrant. The spleen is small. Normal pancreas. Visualized portions of the kidneys are normal. Normal adrenal glands. Musculoskeletal: No chest wall abnormality. No acute or significant osseous findings. Review of the MIP images confirms the above findings. IMPRESSION: 1. Right lower lobe pulmonary embolus involving the lateral and posterior basal segmental arteries. No right heart strain by CT. 2. Medium-sized right pleural effusion with associated atelectasis. 3. A small volume of right and left upper quadrant abdominal ascites with relative left hepatic lobe hypertrophy and mildly nodular hepatic contour that may indicate underlying hepatic cirrhosis. Nonemergent liver ultrasound or abdominal MRI might be helpful for further characterization. 4. 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 Critical Value/emergent results were called by telephone at the time of interpretation on 05/10/2017 at 1:43 pm to Dr. Nita SickleAROLINA VERONESE , who verbally acknowledged these results. Electronically Signed   By: Deatra RobinsonKevin  Herman M.D.   On: 05/13/2017 13:48   Mr Abdomen W HYWo Contrast  Result Date: 05/13/2017 CLINICAL DATA:  Known cirrhosis.  Evaluate suspected liver lesion. EXAM: MRI ABDOMEN WITHOUT AND WITH CONTRAST TECHNIQUE: Multiplanar multisequence MR imaging of the abdomen was performed both before and after the administration of intravenous contrast. CONTRAST:  17mL MULTIHANCE GADOBENATE DIMEGLUMINE 529 MG/ML IV SOLN COMPARISON:  Ultrasound 05/12/2017. FINDINGS: Lower chest: Moderate right pleural effusion and small left pleural effusion identified. The heart size is enlarged.  Hepatobiliary: The liver appears cirrhotic. There is hypertrophy of the lateral segment of left lobe and caudate lobes of liver. The contour the liver is irregular/nodular. Within segment 7 of the liver there is a T2 hyperintense structure measuring 2.2 cm. The post-contrast T1 weighted images are markedly diminished secondary to motion artifact. This affects the immediate, 45 second delayed, 90 second delayed and 3 minutes delayed images. Gallbladder wall edema identified. No significant biliary dilatation. Pancreas: No mass, inflammatory changes, or other parenchymal abnormality identified. Spleen:  Within normal limits in size and appearance. Adrenals/Urinary Tract: No masses identified. No evidence of hydronephrosis. Stomach/Bowel: Visualized portions within the abdomen are unremarkable. Vascular/Lymphatic: Normal appearance of the abdominal aorta. No enlarged upper abdominal lymph nodes. Other:  There is ascites noted within the upper abdomen. Musculoskeletal: No suspicious bone lesions identified. IMPRESSION: 1. Morphologic features of liver consistent with cirrhosis. 2. Study confirms presence of a 2.7 cm indeterminate mass within segment 7 of the liver. Unfortunately, there is marked respiratory motion artifact which significantly diminishes postcontrast imaging detail precluding more definitive assessment of this lesion and in particular evaluation of the enhancement characteristics of this structure. Further evaluation with liver protocol, contrast enhanced CT of the abdomen is advise. The should be less susceptible to motion artifact in this patient who was unable to breath hold for MRI. 3. Ascites 4. Bilateral pleural effusions. Electronically Signed   By: Signa Kellaylor  Stroud M.D.   On: 05/13/2017 09:34   [place] Abdomen Complete  Result Date: 05/12/2017 CLINICAL DATA:  Hepatic cirrhosis EXAM: ABDOMEN ULTRASOUND COMPLETE COMPARISON:  None in PACs FINDINGS: Gallbladder: The gallbladder is adequately distended.  There is mild wall  thickening to 4 mm. There is no pericholecystic fluid or positive sonographic Murphy's sign. No stones or sludge are observed. Common bile duct: Diameter: 3 mm Liver: The hepatic echotexture is increased. In the right hepatic lobe there is a hyperechoic solid-appearing focus measuring 2.7 x 2.3 x 2.5 cm. There is no intrahepatic ductal dilation. There is a small amount of ascites adjacent to the liver. Portal vein is patent on color Doppler imaging with normal direction of blood flow towards the liver. IVC: No abnormality visualized. Pancreas: The pancreas was obscured by bowel gas. Spleen: The pancreas is small measuring 3.4 cm in length. Right Kidney: Length: 10.5 cm. Echogenicity within normal limits. No mass or hydronephrosis visualized. Left Kidney: Length: 10.9 cm. Echogenicity within normal limits. No mass or hydronephrosis visualized. Abdominal aorta: Limited visualization due to bowel gas. The maximal measured diameter is seen proximally 2.6 cm. There is a small amount of ascites. There is a right pleural effusion. Other findings: None. IMPRESSION: Mild gallbladder wall thickening may be related to a small amount of ascites noted adjacent to the liver. No definite pericholecystic fluid is currently visible. No gallstones or sonographic evidence of acute cholecystitis. Increased hepatic echotexture most compatible with fatty infiltrative change or the patient's known cirrhosis. A hyperechoic focus in the right hepatic lobe is most compatible with a hemangioma. However, given the history of cirrhosis, hepatic protocol MRI is recommended. Small amount of ascites adjacent to the liver. There is a right pleural effusion. Electronically Signed   By: Shamecka Hocutt  [place] M.D.   On: 05/12/2017 13:06   US Venous Img Lower Bilateral  Result Date: 05/12/2017 CLINICAL DATA:  History of pulmonary embolism. History of smoking. Evaluate for DVT. EXAM: BILATERAL LOWER EXTREMITY VENOUS DOPPLER ULTRASOUND  TECHNIQUE: Gray-scale sonography with graded compression, as well as color Doppler and duplex ultrasound were performed to evaluate the lower extremity deep venous systems from the level of the common femoral vein and including the common femoral, femoral, profunda femoral, popliteal and calf veins including the posterior tibial, peroneal and gastrocnemius veins when visible. The superficial great saphenous vein was also interrogated. Spectral Doppler was utilized to evaluate flow at rest and with distal augmentation maneuvers in the common femoral, femoral and popliteal veins. COMPARISON:  Chest CTA - 05/17/2017 FINDINGS: RIGHT LOWER EXTREMITY Common Femoral Vein: No evidence of thrombus. Normal compressibility, respiratory phasicity and response to augmentation. Saphenofemoral Junction: No evidence of thrombus. Normal compressibility and flow on color Doppler imaging. Profunda Femoral Vein: No evidence of thrombus. Normal compressibility and flow on color Doppler imaging. Femoral Vein: No evidence of thrombus. Normal compressibility, respiratory phasicity and response to augmentation. Popliteal Vein: No evidence of thrombus. Normal compressibility, respiratory phasicity and response to augmentation. Calf Veins: No evidence of thrombus. Normal compressibility and flow on color Doppler imaging. Superficial Great Saphenous Vein: No evidence of thrombus. Normal compressibility. Venous Reflux:  None. Other Findings:  None. LEFT LOWER EXTREMITY Common Femoral Vein: No evidence of thrombus. Normal compressibility, respiratory phasicity and response to augmentation. Saphenofemoral Junction: No evidence of thrombus. Normal compressibility and flow on color Doppler imaging. Profunda Femoral Vein: No evidence of thrombus. Normal compressibility and flow on color Doppler imaging. Femoral Vein: No evidence of thrombus. Normal compressibility, respiratory phasicity and response to augmentation. Popliteal Vein: No evidence of  thrombus. Normal compressibility, respiratory phasicity and response to augmentation. Calf Veins: No evidence of thrombus. Normal compressibility and flow on color Doppler imaging. Superficial Great Saphenous Vein: No evidence of thrombus. Normal compressibility. Venous Reflux:  None. Other Findings:  None. IMPRESSION: No evidence of DVT within either lower extremity. Electronically Signed   By: Simonne ComeJohn  Watts M.D.   On: 05/12/2017 09:59    ASSESSMENT / PLAN: Symptomatic Bradycardia with hypotension secondary to antihypertensives and beta blocker New onset affib Acute hypoxic respiratory failure secondary to CHF and right sided PE Acute renal failure  Abnormal LFT's and Thrombocytopenia likely secondary to liver cirrhosis  Hx: ETOH Abuse P: Supplemental O2 for hypoxia and/or to maintain O2 sats >92% Continue prn bronchodilator therapy  Hold all antihypertensives and beta blockers Continuous telemetry monitoring Dopamine gtt to maintain map >65  Trend BMP Replace electrolytes as indicated  Monitor UOP Continue po eliquis  Trend CBC  Monitor s/sx of bleeding  Transfuse for hgb <7 Follow CIWA protocol  Continue thiamine, mvi, and folic acid  GI and Cardiology consulted appreciate input  Sonda Rumbleana Blakeney, AGNP  Pulmonary/Critical Care Pager [phone] (please enter 7 digits) PCCM Consult Pager [phone] (please enter 7 digits)   PCCM ATTENDING ATTESTATION:  I have evaluated patient with the APP Blakeney, reviewed database in its entirety and discussed care plan in detail.. I agree with the above findings, assessment and plan   Billy Fischeravid Berish Bohman, MD PCCM service Mobile [phone] Pager [phone] 05/13/2017 5:10 PM

## 2017-05-13 NOTE — Care Management (Signed)
Patient admitted from home with shortness of breath.  diagnosed with pulmonary embolus, CHF  and new onset atrial fib.   Daily consumption of etoh ;on ciwa protocol and scoring 0 - 3.  [facility].  Patient was transferred to icu for bradycardia and hypotension

## 2017-05-13 NOTE — Discharge Instructions (Signed)
Heart Failure Clinic appointment on May 22 2017 at 1:00pm with Clarisa Kindredina Shakera Ebrahimi, FNP. Please call [phone] to reschedule.    Information on my medicine - ELIQUIS (apixaban)  This medication education was reviewed with me or my healthcare representative as part of my discharge preparation.  The pharmacist that spoke with me during my hospital stay was:  Sheema M Hallaji, RPH  Why was Eliquis prescribed for you? Eliquis was prescribed to treat blood clots that may have been found in the veins of your legs (deep vein thrombosis) or in your lungs (pulmonary embolism) and to reduce the risk of them occurring again.  What do You need to know about Eliquis ? The starting dose is 10 mg (two 5 mg tablets) taken TWICE daily for the FIRST SEVEN (7) DAYS, then on (enter date)  05/19/17  the dose is reduced to ONE 5 mg tablet taken TWICE daily.  Eliquis may be taken with or without food.   Try to take the dose about the same time in the morning and in the evening. If you have difficulty swallowing the tablet whole please discuss with your pharmacist how to take the medication safely.  Take Eliquis exactly as prescribed and DO NOT stop taking Eliquis without talking to the doctor who prescribed the medication.  Stopping may increase your risk of developing a new blood clot.  Refill your prescription before you run out.  After discharge, you should have regular check-up appointments with your healthcare provider that is prescribing your Eliquis.    What do you do if you miss a dose? If a dose of ELIQUIS is not taken at the scheduled time, take it as soon as possible on the same day and twice-daily administration should be resumed. The dose should not be doubled to make up for a missed dose.  Important Safety Information A possible side effect of Eliquis is bleeding. You should call your healthcare provider right away if you experience any of the following: ? Bleeding from an injury or your  nose that does not stop. ? Unusual colored urine (red or dark brown) or unusual colored stools (red or black). ? Unusual bruising for unknown reasons. ? A serious fall or if you hit your head (even if there is no bleeding).  Some medicines may interact with Eliquis and might increase your risk of bleeding or clotting while on Eliquis. To help avoid this, consult your healthcare provider or pharmacist prior to using any new prescription or non-prescription medications, including herbals, vitamins, non-steroidal anti-inflammatory drugs (NSAIDs) and supplements.  This website has more information on Eliquis (apixaban): http://www.eliquis.com/eliquis/home

## 2017-05-14 ENCOUNTER — Inpatient Hospital Stay: Payer: Medicare Other

## 2017-05-14 DIAGNOSIS — J9601 Acute respiratory failure with hypoxia: Secondary | ICD-10-CM

## 2017-05-14 LAB — HEPATITIS PANEL, ACUTE
HCV Ab: <0.1 s/co ratio (ref 0.0–0.9)
HEP A IGM: NEGATIVE
HEP B C IGM: NEGATIVE
HEP B S AG: POSITIVE — AB

## 2017-05-14 LAB — HEPATIC FUNCTION PANEL
ALT: 1187 U/L — AB (ref 17–63)
AST: 2224 U/L — AB (ref 15–41)
Albumin: 2.7 g/dL — ABNORMAL LOW (ref 3.5–5.0)
Alkaline Phosphatase: 65 U/L (ref 38–126)
BILIRUBIN DIRECT: 3.6 mg/dL — AB (ref 0.1–0.5)
Indirect Bilirubin: 2.7 mg/dL — ABNORMAL HIGH (ref 0.3–0.9)
Total Bilirubin: 6.3 mg/dL — ABNORMAL HIGH (ref 0.3–1.2)
Total Protein: 5.7 g/dL — ABNORMAL LOW (ref 6.5–8.1)

## 2017-05-14 LAB — CERULOPLASMIN: Ceruloplasmin: 31.4 mg/dL — ABNORMAL HIGH (ref 16.0–31.0)

## 2017-05-14 LAB — MITOCHONDRIAL ANTIBODIES: MITOCHONDRIAL M2 AB, IGG: 5.9 U (ref 0.0–20.0)

## 2017-05-14 LAB — CBC
HCT: 50.8 % (ref 40.0–52.0)
Hemoglobin: 16.7 g/dL (ref 13.0–18.0)
MCH: 31.9 pg (ref 26.0–34.0)
MCHC: 32.8 g/dL (ref 32.0–36.0)
MCV: 97.3 fL (ref 80.0–100.0)
PLATELETS: 91 10*3/uL — AB (ref 150–440)
RBC: 5.22 MIL/uL (ref 4.40–5.90)
RDW: 14.3 % (ref 11.5–14.5)
WBC: 4 10*3/uL (ref 3.8–10.6)

## 2017-05-14 LAB — BASIC METABOLIC PANEL
ANION GAP: 15 (ref 5–15)
BUN: 77 mg/dL — AB (ref 6–20)
CALCIUM: 7.5 mg/dL — AB (ref 8.9–10.3)
CO2: 19 mmol/L — ABNORMAL LOW (ref 22–32)
Chloride: 94 mmol/L — ABNORMAL LOW (ref 101–111)
Creatinine, Ser: 4.34 mg/dL — ABNORMAL HIGH (ref 0.61–1.24)
GFR calc Af Amer: 15 mL/min — ABNORMAL LOW (ref >60)
GFR, EST NON AFRICAN AMERICAN: 13 mL/min — AB (ref >60)
GLUCOSE: 114 mg/dL — AB (ref 65–99)
Potassium: 4.9 mmol/L (ref 3.5–5.1)
Sodium: 128 mmol/L — ABNORMAL LOW (ref 135–145)

## 2017-05-14 LAB — TSH: TSH: 3 u[IU]/mL (ref 0.350–4.500)

## 2017-05-14 LAB — HIV ANTIBODY (ROUTINE TESTING W REFLEX): HIV SCREEN 4TH GENERATION: NONREACTIVE

## 2017-05-14 LAB — PROTIME-INR
INR: >10
Prothrombin Time: >90 seconds — ABNORMAL HIGH (ref 11.4–15.2)

## 2017-05-14 LAB — ANA W/REFLEX IF POSITIVE: Anti Nuclear Antibody(ANA): NEGATIVE

## 2017-05-14 LAB — AFP TUMOR MARKER: AFP, Serum, Tumor Marker: 1.3 ng/mL (ref 0.0–8.3)

## 2017-05-14 LAB — ANTI-SMOOTH MUSCLE ANTIBODY, IGG: F-ACTIN AB IGG: 35 U — AB (ref 0–19)

## 2017-05-14 LAB — TROPONIN I: Troponin I: 59.85 ng/mL (ref <0.03)

## 2017-05-14 MED ORDER — FENTANYL CITRATE (PF) 100 MCG/2ML IJ SOLN
50.0000 ug | Freq: Once | INTRAMUSCULAR | Status: AC
Start: 2017-05-14 — End: 2017-05-14
  Administered 2017-05-14: 50 ug via INTRAVENOUS
  Filled 2017-05-14: qty 2

## 2017-05-14 MED ORDER — SODIUM CHLORIDE 0.9 % IV SOLN
0.0000 ug/min | INTRAVENOUS | Status: DC
  Administered 2017-05-14: 20 ug/min via INTRAVENOUS
  Filled 2017-05-14: qty 4

## 2017-05-14 MED ORDER — HYDROCORTISONE NA SUCCINATE PF 100 MG IJ SOLR
50.0000 mg | Freq: Four times a day (QID) | INTRAMUSCULAR | Status: DC
  Administered 2017-05-14: 50 mg via INTRAVENOUS
  Filled 2017-05-14: qty 2

## 2017-05-14 MED ORDER — FENTANYL CITRATE (PF) 100 MCG/2ML IJ SOLN: 50.0000 ug | Freq: Once | INTRAMUSCULAR | Status: DC

## 2017-05-14 MED ORDER — VASOPRESSIN 20 UNIT/ML IV SOLN
0.0300 [IU]/min | INTRAVENOUS | Status: DC
  Administered 2017-05-14: 0.03 [IU]/min via INTRAVENOUS
  Filled 2017-05-14: qty 2

## 2017-05-14 MED ORDER — FENTANYL 2500MCG IN NS 250ML (10MCG/ML) PREMIX INFUSION
25.0000 ug/h | INTRAVENOUS | Status: DC
  Administered 2017-05-14: 50 ug/h via INTRAVENOUS
  Filled 2017-05-14: qty 250

## 2017-05-14 MED ORDER — FENTANYL NICU BOLUS VIA INFUSION
100.0000 ug | Freq: Once | INTRAVENOUS | Status: AC
  Administered 2017-05-14: 100 ug via INTRAVENOUS
  Filled 2017-05-14: qty 10

## 2017-05-14 MED ORDER — HEPARIN BOLUS VIA INFUSION
4000.0000 [IU] | Freq: Once | INTRAVENOUS | Status: DC
  Filled 2017-05-14: qty 4000

## 2017-05-14 MED ORDER — SODIUM CHLORIDE 0.9 % IV SOLN: 0.0000 ug/min | INTRAVENOUS | Status: DC

## 2017-05-14 MED ORDER — FENTANYL BOLUS VIA INFUSION
25.0000 ug | INTRAVENOUS | Status: DC | PRN
  Filled 2017-05-14: qty 25

## 2017-05-14 MED ORDER — HEPARIN (PORCINE) IN NACL 100-0.45 UNIT/ML-% IJ SOLN: 1050.0000 [IU]/h | INTRAMUSCULAR | Status: DC

## 2017-05-14 MED ORDER — FENTANYL BOLUS VIA INFUSION
50.0000 ug | INTRAVENOUS | Status: DC | PRN
  Filled 2017-05-14: qty 50

## 2017-05-14 MED ORDER — MIDAZOLAM HCL 2 MG/2ML IJ SOLN: 2.0000 mg | INTRAMUSCULAR | Status: DC | PRN

## 2017-05-17 LAB — CULTURE, RESPIRATORY

## 2017-05-17 LAB — CULTURE, RESPIRATORY W GRAM STAIN

## 2017-05-18 LAB — ALPHA-1 ANTITRYPSIN PHENOTYPE: A-1 Antitrypsin, Ser: 151 mg/dL (ref 90–200)

## 2017-05-18 LAB — BLOOD GAS, ARTERIAL
ACID-BASE DEFICIT: 15.5 mmol/L — AB (ref 0.0–2.0)
Bicarbonate: 13.7 mmol/L — ABNORMAL LOW (ref 20.0–28.0)
FIO2: 1
O2 Saturation: 54 %
PCO2 ART: 43 mmHg (ref 32.0–48.0)
PEEP/CPAP: 5 cmH2O
PH ART: 7.11 — AB (ref 7.350–7.450)
Patient temperature: 37
RATE: 20 resp/min
VT: 600 mL
pO2, Arterial: 40 mmHg — CL (ref 83.0–108.0)

## 2017-05-19 ENCOUNTER — Telehealth: Payer: Self-pay

## 2017-05-19 NOTE — Telephone Encounter (Signed)
Funeral home aware death certificate ready.ss

## 2017-05-19 NOTE — Telephone Encounter (Signed)
[facility]  Placed in Nurse box

## 2017-05-22 ENCOUNTER — Ambulatory Visit: Payer: Medicare Other | Admitting: Family

## 2017-06-06 NOTE — Consult Note (Signed)
Reason for Consult:SOB CP ATRIAL fibrillation pulmonary embolus Referring Physician: Dr. Bridgett Larsson hospitalist  Ryan Banks is an 66 y.o. male.  HPI: Patient was brought to the emergency room with shortness of breath patient states he has no significant medical history he does not see any doctors smokes and drinks.  Patient started having symptoms about 4 weeks ago with dyspnea shortness of breath palpitations emergency room his heart rate was found to be 130 chest x-ray suggested cardiomegaly with pulmonary vascular congestion CT of the chest suggested pulmonary embolus on the right side with medium-sized pleural effusion.  LFTs were elevated as well but denies any previous cardiac history was not on any medication.  He denies any recent trauma no recent trips no previous DVTs.  Past Medical History:  Diagnosis Date  . ETOH abuse   . Tobacco abuse    a. smokes one cigar/day.    History reviewed. No pertinent surgical history.  History reviewed. No pertinent family history.  Social History:  reports that he has been smoking cigars.  he has never used smokeless tobacco. He reports that he drinks alcohol. He reports that he does not use drugs.  Allergies:  Allergies  Allergen Reactions  . Penicillins Rash    Has patient had a PCN reaction causing immediate rash, facial/tongue/throat swelling, SOB or lightheadedness with hypotension: Yes Has patient had a PCN reaction causing severe rash involving mucus membranes or skin necrosis: No Has patient had a PCN reaction that required hospitalization: No Has patient had a PCN reaction occurring within the last 10 years: No If all of the above answers are "NO", then may proceed with Cephalosporin use.     Medications: I have reviewed the patient's current medications.  Results for orders placed or performed during the hospital encounter of 05/27/2017 (from the past 48 hour(s))  Hepatic function panel     Status: Abnormal   Collection Time:  05/13/17  4:45 AM  Result Value Ref Range   Total Protein 6.7 6.5 - 8.1 g/dL   Albumin 3.4 (L) 3.5 - 5.0 g/dL   AST 1,907 (H) 15 - 41 U/L   ALT 928 (H) 17 - 63 U/L   Alkaline Phosphatase 61 38 - 126 U/L   Total Bilirubin 6.0 (H) 0.3 - 1.2 mg/dL   Bilirubin, Direct 2.8 (H) 0.1 - 0.5 mg/dL   Indirect Bilirubin 3.2 (H) 0.3 - 0.9 mg/dL  CBC     Status: Abnormal   Collection Time: 05/13/17  5:11 AM  Result Value Ref Range   WBC 8.5 3.8 - 10.6 K/uL   RBC 4.21 (L) 4.40 - 5.90 MIL/uL   Hemoglobin 13.4 13.0 - 18.0 g/dL   HCT 40.4 40.0 - 52.0 %   MCV 96.0 80.0 - 100.0 fL   MCH 31.9 26.0 - 34.0 pg   MCHC 33.3 32.0 - 36.0 g/dL   RDW 14.3 11.5 - 14.5 %   Platelets 83 (L) 150 - 440 K/uL    Comment: PLATELET COUNT CONFIRMED BY SMEAR LARGE PLATELETS SEEN   Protime-INR     Status: Abnormal   Collection Time: 05/13/17  5:11 AM  Result Value Ref Range   Prothrombin Time 86.1 (H) 11.4 - 15.2 seconds   INR 11.14 (HH)     Comment: CRITICAL RESULT CALLED TO, READ BACK BY AND VERIFIED WITH: MARCELL TURNER@0609  ON 05/13/17 BY HKP   Basic metabolic panel     Status: Abnormal   Collection Time: 05/13/17  5:11 AM  Result Value  Ref Range   Sodium 127 (L) 135 - 145 mmol/L   Potassium 4.8 3.5 - 5.1 mmol/L   Chloride 96 (L) 101 - 111 mmol/L   CO2 21 (L) 22 - 32 mmol/L   Glucose, Bld 129 (H) 65 - 99 mg/dL   BUN 61 (H) 6 - 20 mg/dL   Creatinine, Ser 2.79 (H) 0.61 - 1.24 mg/dL   Calcium 8.4 (L) 8.9 - 10.3 mg/dL   GFR calc non Af Amer 22 (L) >60 mL/min   GFR calc Af Amer 25 (L) >60 mL/min    Comment: (NOTE) The eGFR has been calculated using the CKD EPI equation. This calculation has not been validated in all clinical situations. eGFR's persistently <60 mL/min signify possible Chronic Kidney Disease.    Anion gap 10 5 - 15  HIV antibody     Status: None   Collection Time: 05/13/17  5:11 AM  Result Value Ref Range   HIV Screen 4th Generation wRfx Non Reactive Non Reactive    Comment:  (NOTE) Performed At: [facility] [place], [place] [id number] Rush Farmer MD OX:[id number]   ANA w/Reflex if Positive     Status: None   Collection Time: 05/13/17  5:11 AM  Result Value Ref Range   Anit Nuclear Antibody(ANA) Negative Negative    Comment: (NOTE) Performed At: [facility] [place], [place] [id number] Rush Farmer MD WL:[id number]   Ceruloplasmin     Status: Abnormal   Collection Time: 05/13/17  5:11 AM  Result Value Ref Range   Ceruloplasmin 31.4 (H) 16.0 - 31.0 mg/dL    Comment: (NOTE) Performed At: [facility] [place], [place] [id number] Rush Farmer MD EH:[id number]   AFP tumor marker     Status: None   Collection Time: 05/13/17  5:11 AM  Result Value Ref Range   AFP, Serum, Tumor Marker 1.3 0.0 - 8.3 ng/mL    Comment: (NOTE) Roche ECLIA methodology Performed At: [facility] [place], [place] [id number] Rush Farmer MD XA:[id number]   [id number]     Status: Abnormal   Collection Time: 05/13/17  6:31 AM  Result Value Ref Range   Prothrombin Time 87.0 (H) 11.4 - 15.2 seconds   INR 11.28 (HH)     Comment: CRITICAL RESULT CALLED TO, READ BACK BY AND VERIFIED WITH: MARCELL TURNER@0715  ON 05/13/17 BY HKP   MRSA PCR Screening     Status: None   Collection Time: 05/13/17  1:50 PM  Result Value Ref Range   MRSA by PCR NEGATIVE NEGATIVE    Comment:        The GeneXpert MRSA Assay (FDA approved for NASAL specimens only), is one component of a comprehensive MRSA colonization surveillance program. It is not intended to diagnose MRSA infection nor to guide or monitor treatment for MRSA infections.   Glucose, capillary     Status: Abnormal   Collection Time: 05/13/17  3:38 PM  Result Value Ref Range   Glucose-Capillary 106 (H) 65 - 99 mg/dL  Blood gas, arterial     Status: Abnormal (Preliminary result)   Collection Time: 05/13/17  4:30 PM  Result  Value Ref Range   FIO2 1.00    Delivery systems VENTILATOR    Mode PRESSURE REGULATED VOLUME CONTROL    VT 600 mL   LHR 20 resp/min   Peep/cpap 5.0 cm H20   Inspiratory PAP PENDING    Expiratory PAP PENDING    pH, Arterial  7.11 (LL) 7.350 - 7.450    Comment: CRITICAL RESULT CALLED TO, READ BACK BY AND VERIFIED WITH: CRITICAL VALUE 05/13/17,1840,DANA NP/FD    pCO2 arterial 43 32.0 - 48.0 mmHg   pO2, Arterial 40 (LL) 83.0 - 108.0 mmHg    Comment: CRITICAL RESULT CALLED TO, READ BACK BY AND VERIFIED WITH: CRITICAL VALUE 05/13/17,DANA NP/FD    Bicarbonate 13.7 (L) 20.0 - 28.0 mmol/L   Acid-base deficit 15.5 (H) 0.0 - 2.0 mmol/L   O2 Saturation 54.0 %   Patient temperature 37.0    Oxygen index PENDING    Collection site LEFT BRACHIAL    Sample type ARTERIAL DRAW    Allens test (pass/fail) PENDING PASS  Troponin I     Status: Abnormal   Collection Time: 05/13/17  7:51 PM  Result Value Ref Range   Troponin I 6.50 (HH) <0.03 ng/mL    Comment: CRITICAL RESULT CALLED TO, READ BACK BY AND VERIFIED WITH ERICA TAYLOR AT 2027 05/13/2017 BY JJB/TFK.   Comprehensive metabolic panel     Status: Abnormal   Collection Time: 05/13/17  7:51 PM  Result Value Ref Range   Sodium 122 (L) 135 - 145 mmol/L   Potassium 6.1 (H) 3.5 - 5.1 mmol/L   Chloride 92 (L) 101 - 111 mmol/L   CO2 16 (L) 22 - 32 mmol/L   Glucose, Bld 145 (H) 65 - 99 mg/dL   BUN 70 (H) 6 - 20 mg/dL   Creatinine, Ser 3.92 (H) 0.61 - 1.24 mg/dL   Calcium 7.6 (L) 8.9 - 10.3 mg/dL   Total Protein 6.7 6.5 - 8.1 g/dL   Albumin 2.9 (L) 3.5 - 5.0 g/dL   AST 1,975 (H) 15 - 41 U/L   ALT 1,087 (H) 17 - 63 U/L   Alkaline Phosphatase 76 38 - 126 U/L   Total Bilirubin 5.8 (H) 0.3 - 1.2 mg/dL   GFR calc non Af Amer 15 (L) >60 mL/min   GFR calc Af Amer 17 (L) >60 mL/min    Comment: (NOTE) The eGFR has been calculated using the CKD EPI equation. This calculation has not been validated in all clinical situations. eGFR's persistently <60  mL/min signify possible Chronic Kidney Disease.    Anion gap 14 5 - 15  CBC     Status: Abnormal   Collection Time: 05/13/17  9:22 PM  Result Value Ref Range   WBC 2.7 (L) 3.8 - 10.6 K/uL   RBC 5.16 4.40 - 5.90 MIL/uL   Hemoglobin 16.2 13.0 - 18.0 g/dL   HCT 50.9 40.0 - 52.0 %   MCV 98.6 80.0 - 100.0 fL   MCH 31.5 26.0 - 34.0 pg   MCHC 31.9 (L) 32.0 - 36.0 g/dL   RDW 14.5 11.5 - 14.5 %   Platelets 84 (L) 150 - 440 K/uL    Comment: PLATELET COUNT CONFIRMED BY SMEAR GIANT PLATELETS SEEN   Blood gas, arterial     Status: Abnormal   Collection Time: 05/13/17 10:06 PM  Result Value Ref Range   FIO2 100.00    Delivery systems VENTILATOR    Mode PRESSURE REGULATED VOLUME CONTROL    VT 600 mL   LHR 20 resp/min   pH, Arterial 7.29 (L) 7.350 - 7.450   pCO2 arterial 28 (L) 32.0 - 48.0 mmHg   pO2, Arterial 53 (L) 83.0 - 108.0 mmHg   Bicarbonate 13.5 (L) 20.0 - 28.0 mmol/L   Acid-base deficit 11.3 (H) 0.0 - 2.0 mmol/L   O2  Saturation 82.5 %   Patient temperature 37.0    Collection site RIGHT RADIAL    Sample type ARTHROGRAPHIS SPECIES    Allens test (pass/fail) PASS PASS  Troponin I     Status: Abnormal   Collection Time: 06/13/17  4:52 AM  Result Value Ref Range   Troponin I 59.85 (HH) <0.03 ng/mL    Comment: CRITICAL RESULT CALLED TO, READ BACK BY AND VERIFIED WITH TONI WALKER @ [phone] ON 2017/06/13 BY CAF   CBC     Status: Abnormal   Collection Time: Jun 13, 2017  4:52 AM  Result Value Ref Range   WBC 4.0 3.8 - 10.6 K/uL   RBC 5.22 4.40 - 5.90 MIL/uL   Hemoglobin 16.7 13.0 - 18.0 g/dL   HCT 50.8 40.0 - 52.0 %   MCV 97.3 80.0 - 100.0 fL   MCH 31.9 26.0 - 34.0 pg   MCHC 32.8 32.0 - 36.0 g/dL   RDW 14.3 11.5 - 14.5 %   Platelets 91 (L) 150 - 440 K/uL    Comment: PLATELET COUNT CONFIRMED BY SMEAR  Basic metabolic panel     Status: Abnormal   Collection Time: Jun 13, 2017  4:52 AM  Result Value Ref Range   Sodium 128 (L) 135 - 145 mmol/L   Potassium 4.9 3.5 - 5.1 mmol/L    Chloride 94 (L) 101 - 111 mmol/L   CO2 19 (L) 22 - 32 mmol/L   Glucose, Bld 114 (H) 65 - 99 mg/dL   BUN 77 (H) 6 - 20 mg/dL   Creatinine, Ser 4.34 (H) 0.61 - 1.24 mg/dL   Calcium 7.5 (L) 8.9 - 10.3 mg/dL   GFR calc non Af Amer 13 (L) >60 mL/min   GFR calc Af Amer 15 (L) >60 mL/min    Comment: (NOTE) The eGFR has been calculated using the CKD EPI equation. This calculation has not been validated in all clinical situations. eGFR's persistently <60 mL/min signify possible Chronic Kidney Disease.    Anion gap 15 5 - 15  Hepatic function panel     Status: Abnormal   Collection Time: 06/13/17  4:52 AM  Result Value Ref Range   Total Protein 5.7 (L) 6.5 - 8.1 g/dL   Albumin 2.7 (L) 3.5 - 5.0 g/dL   AST 2,224 (H) 15 - 41 U/L    Comment: RESULTS CONFIRMED BY MANUAL DILUTION   ALT 1,187 (H) 17 - 63 U/L   Alkaline Phosphatase 65 38 - 126 U/L   Total Bilirubin 6.3 (H) 0.3 - 1.2 mg/dL   Bilirubin, Direct 3.6 (H) 0.1 - 0.5 mg/dL   Indirect Bilirubin 2.7 (H) 0.3 - 0.9 mg/dL  TSH     Status: None   Collection Time: 2017-06-13  4:52 AM  Result Value Ref Range   TSH 3.000 0.350 - 4.500 uIU/mL    Comment: Performed by a 3rd Generation assay with a functional sensitivity of <=0.01 uIU/mL.  Protime-INR     Status: Abnormal   Collection Time: 06/13/2017 12:14 PM  Result Value Ref Range   Prothrombin Time >90.0 (H) 11.4 - 15.2 seconds   INR >10.00 (HH)     Comment: RESULT REPEATED AND VERIFIED CRITICAL RESULT CALLED TO, READ BACK BY AND VERIFIED WITH:  Aldona Lento AT 3291 Jun 13, 2017 SDR     Dg Abd 1 View  Result Date: 05/13/2017 CLINICAL DATA:  Orogastric tube placement EXAM: ABDOMEN - 1 VIEW COMPARISON:  Chest 05/31/2017 FINDINGS: Enteric tube tip is in the left upper quadrant consistent  with location in the upper stomach. Also partially visualized are an endotracheal tube with tip measuring 4.7 cm above the carina and a central venous catheter with tip over the low SVC region. Small right pleural  effusion. Airspace consolidation in the lung bases. IMPRESSION: Enteric tube tip is in the left upper quadrant consistent with location in the upper stomach. Consolidation in the lung bases with small right pleural effusion. Electronically Signed   By: Lucienne Capers M.D.   On: 05/13/2017 22:51   Mr Abdomen W Wo Contrast  Result Date: 05/13/2017 CLINICAL DATA:  Known cirrhosis.  Evaluate suspected liver lesion. EXAM: MRI ABDOMEN WITHOUT AND WITH CONTRAST TECHNIQUE: Multiplanar multisequence MR imaging of the abdomen was performed both before and after the administration of intravenous contrast. CONTRAST:  27m MULTIHANCE GADOBENATE DIMEGLUMINE 529 MG/ML IV SOLN COMPARISON:  Ultrasound 05/12/2017. FINDINGS: Lower chest: Moderate right pleural effusion and small left pleural effusion identified. The heart size is enlarged. Hepatobiliary: The liver appears cirrhotic. There is hypertrophy of the lateral segment of left lobe and caudate lobes of liver. The contour the liver is irregular/nodular. Within segment 7 of the liver there is a T2 hyperintense structure measuring 2.2 cm. The post-contrast T1 weighted images are markedly diminished secondary to motion artifact. This affects the immediate, 45 second delayed, 90 second delayed and 3 minutes delayed images. Gallbladder wall edema identified. No significant biliary dilatation. Pancreas: No mass, inflammatory changes, or other parenchymal abnormality identified. Spleen:  Within normal limits in size and appearance. Adrenals/Urinary Tract: No masses identified. No evidence of hydronephrosis. Stomach/Bowel: Visualized portions within the abdomen are unremarkable. Vascular/Lymphatic: Normal appearance of the abdominal aorta. No enlarged upper abdominal lymph nodes. Other:  There is ascites noted within the upper abdomen. Musculoskeletal: No suspicious bone lesions identified. IMPRESSION: 1. Morphologic features of liver consistent with cirrhosis. 2. Study confirms  presence of a 2.7 cm indeterminate mass within segment 7 of the liver. Unfortunately, there is marked respiratory motion artifact which significantly diminishes postcontrast imaging detail precluding more definitive assessment of this lesion and in particular evaluation of the enhancement characteristics of this structure. Further evaluation with liver protocol, contrast enhanced CT of the abdomen is advise. The should be less susceptible to motion artifact in this patient who was unable to breath hold for MRI. 3. Ascites 4. Bilateral pleural effusions. Electronically Signed   By: TKerby MoorsM.D.   On: 05/13/2017 09:34   Dg Chest Port 1 View  Result Date: 12018-11-16CLINICAL DATA:  Acute respiratory failure EXAM: PORTABLE CHEST 1 VIEW COMPARISON:  Chest radiograph from one day prior. FINDINGS: Endotracheal tube tip is 3.8 cm above the carina. Right internal jugular central venous catheter terminates in the middle third of the superior vena cava. Pacer pad overlies the left lower chest. Enteric tube terminates in the stomach. Stable cardiomediastinal silhouette with mild cardiomegaly. No pneumothorax. Stable small bilateral pleural effusions, right greater the left. Moderate pulmonary edema, improved. Bilateral lower lobe atelectasis, stable. IMPRESSION: 1. Well-positioned support structures as detailed. 2. Moderate congestive heart failure, improved. 3. Stable small bilateral pleural effusions and bibasilar atelectasis. Electronically Signed   By: JIlona SorrelM.D.   On: 12018/05/1609:10   Dg Chest Port 1 View  Result Date: 05/13/2017 CLINICAL DATA:  Central line placement. EXAM: PORTABLE CHEST 1 VIEW COMPARISON:  05/08/2017 chest radiograph FINDINGS: An endotracheal tube is noted with tip 3.3 cm above the carina. A right IJ central venous catheter is present with tip overlying the mid-lower SVC. Cardiomegaly  again noted. Diffuse bilateral airspace opacities are present. There is no evidence of  pneumothorax. There may be trace bilateral pleural effusions present. A defibrillator pad overlying the left chest is noted. IMPRESSION: Endotracheal tube with tip 3.3 cm above the carina and right IJ central venous catheter with tip overlying the mid-lower SVC. No evidence of pneumothorax. New diffuse bilateral airspace opacities/ edema. Electronically Signed   By: Margarette Canada M.D.   On: 05/13/2017 19:10    Review of Systems  Constitutional: Positive for diaphoresis and malaise/fatigue.  HENT: Positive for congestion.   Eyes: Negative.   Respiratory: Positive for cough and shortness of breath.   Cardiovascular: Positive for chest pain, palpitations, orthopnea, leg swelling and PND.  Gastrointestinal: Negative.   Genitourinary: Negative.   Musculoskeletal: Negative.   Skin: Negative.   Neurological: Positive for dizziness and weakness.  Endo/Heme/Allergies: Negative.   Psychiatric/Behavioral: Negative.    Blood pressure (!) 68/46, pulse (!) 116, temperature 98 F (36.7 C), temperature source Axillary, resp. rate (!) 24, height 5' 5"  (1.651 m), weight 190 lb 11.2 oz (86.5 kg), SpO2 (!) 85 %. Physical Exam  Nursing note and vitals reviewed. Constitutional: He is oriented to person, place, and time. He appears well-developed and well-nourished.  HENT:  Head: Normocephalic and atraumatic.  Eyes: Conjunctivae and EOM are normal. Pupils are equal, round, and reactive to light.  Neck: Normal range of motion. Neck supple.  Cardiovascular: S1 normal, S2 normal and normal pulses. An irregularly irregular rhythm present. Tachycardia present. Exam reveals distant heart sounds.  Murmur heard.  Systolic murmur is present with a grade of 2/6. Respiratory: Effort normal and breath sounds normal.  GI: Soft. Bowel sounds are normal.  Musculoskeletal: Normal range of motion.  Neurological: He is alert and oriented to person, place, and time. He has normal reflexes.  Skin: Skin is warm and dry.   Psychiatric: He has a normal mood and affect.    Assessment/Plan: Atrial fibrillation RVR new Acute pulmonary embolus Acute  heart failure Elevated troponin Coronary artery disease Pleural effusion Abnormal liver test Tobacco abuse Alcohol abuse . Plan  agree with anticoagulation for A. fib as well as PE Continue telemetry Recommend treatment for possible withdrawal Cardizem for atrial fibrillation rate control IV Lasix for heart failure GI consult for hepatitis Correct electrolytes including hypo-natremia Advised patient to refrain from tobacco abuse Advised the patient to refrain from alcohol abuse Echocardiogram will be helpful for assessment of LV function structures  Ajane Novella D Cassanda Walmer 05/19/2017, 1:29 PM

## 2017-06-06 NOTE — Progress Notes (Signed)
[employer] - [place] [facility]   PATIENT NAME: Ryan Banks    MR#:  [id number]  DATE OF BIRTH:  07/17/1950  SUBJECTIVE:   Patient here due to shortness of breath and noted to have an acute pulmonary embolism, also noted to be in acute kidney injury and also with CT scan/ultrasound findings suggestive of liver cirrhosis.  The patient is on vent, sedation, vasopressin, neo and levophed drip. BP is still low at 68/46. GI bleeding from NG tube since last night. Tropnin is up to 59.85, INR>10 just now. REVIEW OF SYSTEMS:    Review of Systems  Unable to perform ROS: Intubated    DRUG ALLERGIES:   Allergies  Allergen Reactions  . Penicillins Rash    Has patient had a PCN reaction causing immediate rash, facial/tongue/throat swelling, SOB or lightheadedness with hypotension: Yes Has patient had a PCN reaction causing severe rash involving mucus membranes or skin necrosis: No Has patient had a PCN reaction that required hospitalization: No Has patient had a PCN reaction occurring within the last 10 years: No If all of the above answers are "NO", then may proceed with Cephalosporin use.     VITALS:  Blood pressure (!) 68/46, pulse (!) 116, temperature 98 F (36.7 C), temperature source Axillary, resp. rate (!) 24, height 5\' 5"  (1.651 m), weight 190 lb 11.2 oz (86.5 kg), SpO2 (!) 85 %.  PHYSICAL EXAMINATION:   Physical Exam  GENERAL:  66 y.o.-year-old patient is on vent. EYES: Pupils equal, round, pinpoint, not reactive to light.  HEENT: Head atraumatic, normocephalic. NECK:  Supple, no jugular venous distention. No thyroid enlargement, no tenderness.  LUNGS: diminished breath sound, no wheezing, bibasilar mild rales, no rhonchi.  CARDIOVASCULAR: S1, S2 normal. No murmurs, rubs, or gallops.  ABDOMEN: Soft, nontender, nondistended. Bowel sounds present, hypoactive.  EXTREMITIES: No cyanosis, clubbing or edema.    NEUROLOGIC: unable to exam.  PSYCHIATRIC: The  patient is on vent.  SKIN: No obvious rash, lesion, or ulcer.    LABORATORY PANEL:   CBC Recent Labs  Lab 11-24-16 0452  WBC 4.0  HGB 16.7  HCT 50.8  PLT 91*   ------------------------------------------------------------------------------------------------------------------  Chemistries  Recent Labs  Lab 05/30/2017 2244  11-24-16 0452  NA  --    < > 128*  K  --    < > 4.9  CL  --    < > 94*  CO2  --    < > 19*  GLUCOSE  --    < > 114*  BUN  --    < > 77*  CREATININE  --    < > 4.34*  CALCIUM  --    < > 7.5*  MG 2.0  --   --   AST  --    < > 2,224*  ALT  --    < > 1,187*  ALKPHOS  --    < > 65  BILITOT  --    < > 6.3*   < > = values in this interval not displayed.   ------------------------------------------------------------------------------------------------------------------  Cardiac Enzymes Recent Labs  Lab 11-24-16 0452  TROPONINI 59.85*   ------------------------------------------------------------------------------------------------------------------  RADIOLOGY:  Dg Abd 1 View  Result Date: 05/13/2017 CLINICAL DATA:  Orogastric tube placement EXAM: ABDOMEN - 1 VIEW COMPARISON:  Chest 05/27/2017 FINDINGS: Enteric tube tip is in the left upper quadrant consistent with location in the upper stomach. Also partially visualized are an endotracheal tube with tip measuring 4.7 cm above the  carina and a central venous catheter with tip over the low SVC region. Small right pleural effusion. Airspace consolidation in the lung bases. IMPRESSION: Enteric tube tip is in the left upper quadrant consistent with location in the upper stomach. Consolidation in the lung bases with small right pleural effusion. Electronically Signed   By: Burman NievesWilliam  Stevens M.D.   On: 05/13/2017 22:51   Mr Abdomen W Wo Contrast  Result Date: 05/13/2017 CLINICAL DATA:  Known cirrhosis.  Evaluate suspected liver lesion. EXAM: MRI ABDOMEN WITHOUT AND WITH CONTRAST TECHNIQUE: Multiplanar  multisequence MR imaging of the abdomen was performed both before and after the administration of intravenous contrast. CONTRAST:  17mL MULTIHANCE GADOBENATE DIMEGLUMINE 529 MG/ML IV SOLN COMPARISON:  Ultrasound 05/12/2017. FINDINGS: Lower chest: Moderate right pleural effusion and small left pleural effusion identified. The heart size is enlarged. Hepatobiliary: The liver appears cirrhotic. There is hypertrophy of the lateral segment of left lobe and caudate lobes of liver. The contour the liver is irregular/nodular. Within segment 7 of the liver there is a T2 hyperintense structure measuring 2.2 cm. The post-contrast T1 weighted images are markedly diminished secondary to motion artifact. This affects the immediate, 45 second delayed, 90 second delayed and 3 minutes delayed images. Gallbladder wall edema identified. No significant biliary dilatation. Pancreas: No mass, inflammatory changes, or other parenchymal abnormality identified. Spleen:  Within normal limits in size and appearance. Adrenals/Urinary Tract: No masses identified. No evidence of hydronephrosis. Stomach/Bowel: Visualized portions within the abdomen are unremarkable. Vascular/Lymphatic: Normal appearance of the abdominal aorta. No enlarged upper abdominal lymph nodes. Other:  There is ascites noted within the upper abdomen. Musculoskeletal: No suspicious bone lesions identified. IMPRESSION: 1. Morphologic features of liver consistent with cirrhosis. 2. Study confirms presence of a 2.7 cm indeterminate mass within segment 7 of the liver. Unfortunately, there is marked respiratory motion artifact which significantly diminishes postcontrast imaging detail precluding more definitive assessment of this lesion and in particular evaluation of the enhancement characteristics of this structure. Further evaluation with liver protocol, contrast enhanced CT of the abdomen is advise. The should be less susceptible to motion artifact in this patient who was  unable to breath hold for MRI. 3. Ascites 4. Bilateral pleural effusions. Electronically Signed   By: Signa Kellaylor  Stroud M.D.   On: 05/13/2017 09:34   Dg Chest Port 1 View  Result Date: 05/29/2017 CLINICAL DATA:  Acute respiratory failure EXAM: PORTABLE CHEST 1 VIEW COMPARISON:  Chest radiograph from one day prior. FINDINGS: Endotracheal tube tip is 3.8 cm above the carina. Right internal jugular central venous catheter terminates in the middle third of the superior vena cava. Pacer pad overlies the left lower chest. Enteric tube terminates in the stomach. Stable cardiomediastinal silhouette with mild cardiomegaly. No pneumothorax. Stable small bilateral pleural effusions, right greater the left. Moderate pulmonary edema, improved. Bilateral lower lobe atelectasis, stable. IMPRESSION: 1. Well-positioned support structures as detailed. 2. Moderate congestive heart failure, improved. 3. Stable small bilateral pleural effusions and bibasilar atelectasis. Electronically Signed   By: Delbert PhenixJason A Poff M.D.   On: 11/29/2016 10:10   Dg Chest Port 1 View  Result Date: 05/13/2017 CLINICAL DATA:  Central line placement. EXAM: PORTABLE CHEST 1 VIEW COMPARISON:  05/30/2017 chest radiograph FINDINGS: An endotracheal tube is noted with tip 3.3 cm above the carina. A right IJ central venous catheter is present with tip overlying the mid-lower SVC. Cardiomegaly again noted. Diffuse bilateral airspace opacities are present. There is no evidence of pneumothorax. There may be trace bilateral pleural  effusions present. A defibrillator pad overlying the left chest is noted. IMPRESSION: Endotracheal tube with tip 3.3 cm above the carina and right IJ central venous catheter with tip overlying the mid-lower SVC. No evidence of pneumothorax. New diffuse bilateral airspace opacities/ edema. Electronically Signed   By: Harmon Pier M.D.   On: 05/13/2017 19:10     ASSESSMENT AND PLAN:   66 year old male with no significant past medical  history presented to the hospital due to shortness of breath.  1. Acute respiratory failure with hypoxia-secondary to cardiac arrest, CHF and also pulmonary embolism. Continue vent and sedation. F/u Dr. Belia Heman.  2. Acute pulmonary embolism-this is a unprovoked pulmonary embolism. -Patient was on subcutaneous Lovenox, switched to Eliquis. Dopplers of lower extremity negative for DVT. Not on anticoagulation due to GIB and high INR.  3. 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 Echo Cardizem showing ejection fraction of 30-35%. He was on IV Lasix, carvedilol, Lisinopril.   Hold lasix and lisinopril and spironolactone due to renal failure and hypotension.  4. A. Fib with RVR- new onset in nature.  hold Coreg, Cardizem due to bradycardia. He was on Eliquis.   5. Liver Cirrhosis with Abnormal LFTs The patient was incidentally noted to have liver cirrhosis on his CT chest. Abdominal ultrasound confirms that. Patient does have a history of alcohol abuse. No evidence of hepatic encephalopathy or ascites. Will need EGD for variceal screening,  AFP and Mri liver mass protocol to delineate liver lesion seen [place], also need colonoscopy for colon cancer screening as outpt per Dr. Allegra Lai.  Liver failure due to above. Poor prognosis.  6. History of alcohol abuse, he was on CIWA protocol.  7. Thrombocytopenia-secondary to alcohol abuse and liver cirrhosis. F/u CBC.  8. Acute kidney injury, worsening today.oliguria. Hold Lasix, lisinopril, follow-up BMP.  Elevated INR with GIB, possible due to DIC.  Hypotension with bradycardia, cardiogenic shock. On levophed, neo and vasopressin drip.  Cardiac arrest with PEA. S/p CPR and intubated by Dr. Sung Amabile.  Very poor prognosis, may die any time.  All the records are reviewed and case discussed with Care Management/Social Worker. Management plans discussed with the patient, family and they are  in agreement.  CODE STATUS: Full code  TOTAL CRITICAL TIME TAKING CARE OF THIS PATIENT: 37 minutes.   POSSIBLE D/C IN ? DAYS, DEPENDING ON CLINICAL CONDITION.   Shaune Pollack M.D on 05/20/2017 at 1:29 PM  Between 7am to 6pm - Pager - [phone]  After 6pm go to [email or web address] - Therapist, nutritional Hospitalists  Office  [phone]  CC: Primary care physician; Center, [facility]

## 2017-06-06 NOTE — Progress Notes (Signed)
Pt. Was suctioned prior to extubation for no secretions. He was extubated per Sonda Rumbleana Blakeney NP's order. He is to be comfort care.

## 2017-06-06 NOTE — Progress Notes (Signed)
CRITICAL VALUE ALERT  Critical Value:  Troponin 59.85  Date & Time Notied:  05/16/2017 @ 0610  Provider Notified: Lexine BatonBincy, NP  Orders Received/Actions taken: Acknowledged and consult for heparin dosing.

## 2017-06-06 NOTE — Consult Note (Signed)
Name: Ryan Banks MRN: [id number] DOB: [birth year]    ADMISSION DATE:  05/20/2017 CONSULTATION DATE: 05/13/2017  REFERRING MD : Dr. Bridgett Larsson   CHIEF COMPLAINT: Symptomatic Bradycardia   BRIEF PATIENT DESCRIPTION:  66 yo male admitted to telemetry unit 11/5 due to afibb with RVR, acute respiratory failure secondary to right sided PE and CHF exacerbation.  He developed symptomatic bradycardia and hypotension on 11/7 requiring transfer to ICU for dopamine gtt   SIGNIFICANT EVENTS  11/5-Pt admitted to telemetry unit  11/7-Pt transferred to ICU   STUDIES:  CT Angio Chest PE 11/5>>Right lower lobe pulmonary embolus involving the lateral and posterior basal segmental arteries. No right heart strain by CT. Medium-sized right pleural effusion with associated atelectasis. A small volume of right and left upper quadrant abdominal ascites with relative left hepatic lobe hypertrophy and mildly nodular hepatic contour that may indicate underlying hepatic cirrhosis.Nonemergent liver ultrasound or abdominal MRI might be helpful for further characterization. Coronary artery and aortic atherosclerosis MR Abdomen 11/6>>Morphologic features of liver consistent with cirrhosis. Study confirms presence of a 2.7 cm indeterminate mass within segment 7 of the liver. Unfortunately, there is marked respiratory motion artifact which significantly diminishes postcontrast imaging detail precluding more definitive assessment of this lesion and in particular evaluation of the enhancement characteristics of this structure. Further evaluation with liver protocol, contrast enhanced CT of the abdomen is advise. The should be less susceptible to motion artifact in this patient who was unable to breath hold for MRI. Ascites Bilateral pleural effusions. US Venous Lower Bilat 11/6>>No evidence of DVT within either lower extremity. [place] Abd 11/6>>Mild gallbladder wall thickening may be related to a small amount of ascites noted adjacent to  the liver. No definite pericholecystic fluid is currently visible. No gallstones or sonographic evidence of acute cholecystitis. Increased hepatic echotexture most compatible with fatty infiltrative change or the patient's known cirrhosis. A hyperechoic focus in the right hepatic lobe is most compatible with a hemangioma. However, given the history of cirrhosis, hepatic protocol MRI is recommended. Small amount of ascites adjacent to the liver. There is a right pleural effusion. Echo 11/6>>EF 30%-35%   HISTORY OF PRESENT ILLNESS:   This is a 66 yo male with a PMH of Tobacco Abuse and ETOH abuse.  He presented to [facility] ER 11/1 with shortness of breath onset of symptoms 3 weeks ago and has progressively worsened.  He also endorsed new onset bilateral lower extremity edema.  In the ER CXR concerning for low grade CHF with questionable RLL pneumonia and CT Angio revealing right sided PE.  He was also found to have ascites and MR Abd results concerning for cirrhosis. He was initially started on subq lovenox admitted to the telemetry unit by hospitalist team for further workup and treatment. On 11/7 pt developed symptomatic bradycardia with hypotension requiring transfer to ICU PCCM consulted.      SUBJECTIVE Patient remains intubated On 3 vasopressors fio2 at 100% Prognosis is poor Critically ill High chance of dying   REVIEW OF SYSTEMS:  Unable to obtain due to critical illness   VITAL SIGNS: Temp:  [93.7 F (34.3 C)-97.8 F (36.6 C)] 97.8 F (36.6 C) (11/08 0000) Pulse Rate:  [39-129] 127 (11/08 0700) Resp:  [20-35] 24 (11/08 0700) BP: (35-122)/(20-96) 98/61 (11/08 0700) SpO2:  [75 %-100 %] 85 % (11/08 0700) FiO2 (%):  [100 %] 100 % (11/08 0451) Weight:  [190 lb 11.2 oz (86.5 kg)] 190 lb 11.2 oz (86.5 kg) (11/08 0309)  PHYSICAL EXAMINATION:  General: critically ill  Neuro: gcs<8T HEENT: sclera icterus , no JVD Cardiovascular: sinus brady, s1s2, no M/R/G Lungs: diminished all right  lobes, clear throughout left lobes, slightly tachypneic and labored  Abdomen: +BS x4, soft, non tender, mildly distended  Musculoskeletal: normal bulk and tone, no edema  Skin: intact no rashes or lesions   Recent Labs  Lab 05/13/17 0511 05/13/17 1951 2017-06-12 0452  NA 127* 122* 128*  K 4.8 6.1* 4.9  CL 96* 92* 94*  CO2 21* 16* 19*  BUN 61* 70* 77*  CREATININE 2.79* 3.92* 4.34*  GLUCOSE 129* 145* 114*   Recent Labs  Lab 05/13/17 0511 05/13/17 2122 12-Jun-2017 0452  HGB 13.4 16.2 16.7  HCT 40.4 50.9 50.8  WBC 8.5 2.7* 4.0  PLT 83* 84* 91*    ASSESSMENT / PLAN:  66 year old African-American male admitted to the ICU for acute CHF exacerbation with new onset A. fib subsequently with acute cardiac arrest and severe respiratory failure and cardiogenic shock in the setting of PE and liver cirrhosis patient currently on several vasopressors and full ventilatory support, patient at high risk for cardiac arrest and death   1.Respiratory Failure -continue Full MV support -continue Bronchodilator Therapy -Wean Fio2 and PEEP as tolerated -will perform SAT/SBt when respiratory parameters are met  2.cardiogenic shock Continue vasopressors  3.liver failure 4.renal failure  Overall prognosis is very poor patient will likely die in the next 24-48 hrs.    Critical Care Time devoted to patient care services described in this note is 45 minutes.   Overall, patient is critically ill, prognosis is guarded.  Patient with Multiorgan failure and at high risk for cardiac arrest and death.    Corrin Snellgrove, M.D.  [facility]  Medical Director [place] Director [facility] Cardio-Pulmonary Department

## 2017-06-06 NOTE — Progress Notes (Signed)
After further assessment patient has multiorgan failure in the setting of end-stage liver cirrhosis he has respiratory failure cardiac failure renal failure and liver failure.  I have spoken to the family at bedside and they have consented and agreed to comfort care measures I spoke with Burtis JunesShanta today the daughter as well as Hazel.  They are both in agreement and the family is in agreement to make the patient comfort care.   Family are satisfied with Plan of action and management. All questions answered  Lucie LeatherKurian David Micholas Drumwright, M.D.  [facility]  Medical Director [facility] Medical Director [facility] Cardio-Pulmonary Department

## 2017-06-06 NOTE — Progress Notes (Signed)
Patient with oxygen saturations in low 60's vent support at 100%. Patient manually bagged though ineffective. Diastolic b/p unresponsive to IV titrations. Notified NP and daughter. Awaiting arrival of daughter for further management.

## 2017-06-06 NOTE — Death Summary Note (Signed)
  DEATH SUMMARY   Patient Details  Name: Ryan Banks MRN: [id number] DOB: 02/19/1951  Admission/Discharge Information   Admit Date:  05/16/2017  Date of Death: Date of Death: 06/04/2017  Time of Death: Time of Death: 1541  Length of Stay: 3  Referring Physician: Center, [facility]   Reason(s) for Hospitalization  PE, resp failure, cardiac arrest  Diagnoses  Preliminary cause of death:  Secondary Diagnoses (including complications and co-morbidities):  Active Problems:   Pulmonary embolism (HCC) Cardiac arrest Liver cirrhosis Renal Failure Liver failure  Brief Hospital Course (including significant findings, care, treatment, and services provided and events leading to death)  Ryan AnaSamuel W Height is a 66 y.o. year old male who was admitted to ICU for bradycardia Subsequently developed acute cardiac arrest with CHF, liver failure and renal failure  Patient with multiorgan failure  Family made patient DNR And after further assessment, patients family agreed and consented to Comfort care status   Pertinent Labs and Studies  Significant Diagnostic Studies   Lab Basic Metabolic Panel: Recent Labs  Lab June 01, 2017 1211 June 01, 2017 2244 05/12/17 0445 05/13/17 0511 05/13/17 1951 05/16/2017 0452  NA 132*  --  139 127* 122* 128*  K 4.4  --  3.7 4.8 6.1* 4.9  CL 99*  --  105 96* 92* 94*  CO2 21*  --  27 21* 16* 19*  GLUCOSE 180*  --  157* 129* 145* 114*  BUN 35*  --  17 61* 70* 77*  CREATININE 1.56*  --  1.01 2.79* 3.92* 4.34*  CALCIUM 9.1  --  8.6* 8.4* 7.6* 7.5*  MG  --  2.0  --   --   --   --    Liver Function Tests: Recent Labs  Lab June 01, 2017 1211 05/13/17 0445 05/13/17 1951 05/23/2017 0452  AST 451* 1,907* 1,975* 2,224*  ALT 352* 928* 1,087* 1,187*  ALKPHOS 64 61 76 65  BILITOT 3.9* 6.0* 5.8* 6.3*  PROT 7.9 6.7 6.7 5.7*  ALBUMIN 3.8 3.4* 2.9* 2.7*   No results for input(s): LIPASE, AMYLASE in the last 168 hours. No results for input(s): AMMONIA in the  last 168 hours. CBC: Recent Labs  Lab June 01, 2017 1211 05/12/17 0602 05/13/17 0511 05/13/17 2122 06/01/2017 0452  WBC 7.9 8.3 8.5 2.7* 4.0  HGB 15.9 15.0 13.4 16.2 16.7  HCT 47.9 45.5 40.4 50.9 50.8  MCV 96.6 96.2 96.0 98.6 97.3  PLT 99* 95* 83* 84* 91*   Cardiac Enzymes: Recent Labs  Lab June 01, 2017 1211 June 01, 2017 2244 05/13/17 1951 06/05/2017 0452  TROPONINI 0.05* 0.06* 6.50* 59.85*   Sepsis Labs: Recent Labs  Lab 05/12/17 0602 05/13/17 0511 05/13/17 2122 05/10/2017 0452  WBC 8.3 8.5 2.7* 4.0

## 2017-06-06 NOTE — Progress Notes (Signed)
CRITICAL VALUE ALERT  Critical Value:  PT/INR: PT>90 INR>10  Date & Time Notied:  05/30/2017 @1308  Provider Notified:Dana Salem CasterBlakeney, NP Orders Received/Actions taken: No new orders

## 2017-06-06 DEATH — deceased

## 2018-04-27 IMAGING — DX DG CHEST 1V PORT
1 series · 1 of 1 positions shown · non-contrast
Comparison: Chest radiograph from one day prior.

CLINICAL DATA: Acute respiratory failure

EXAM:
PORTABLE CHEST 1 VIEW

[chest ap]
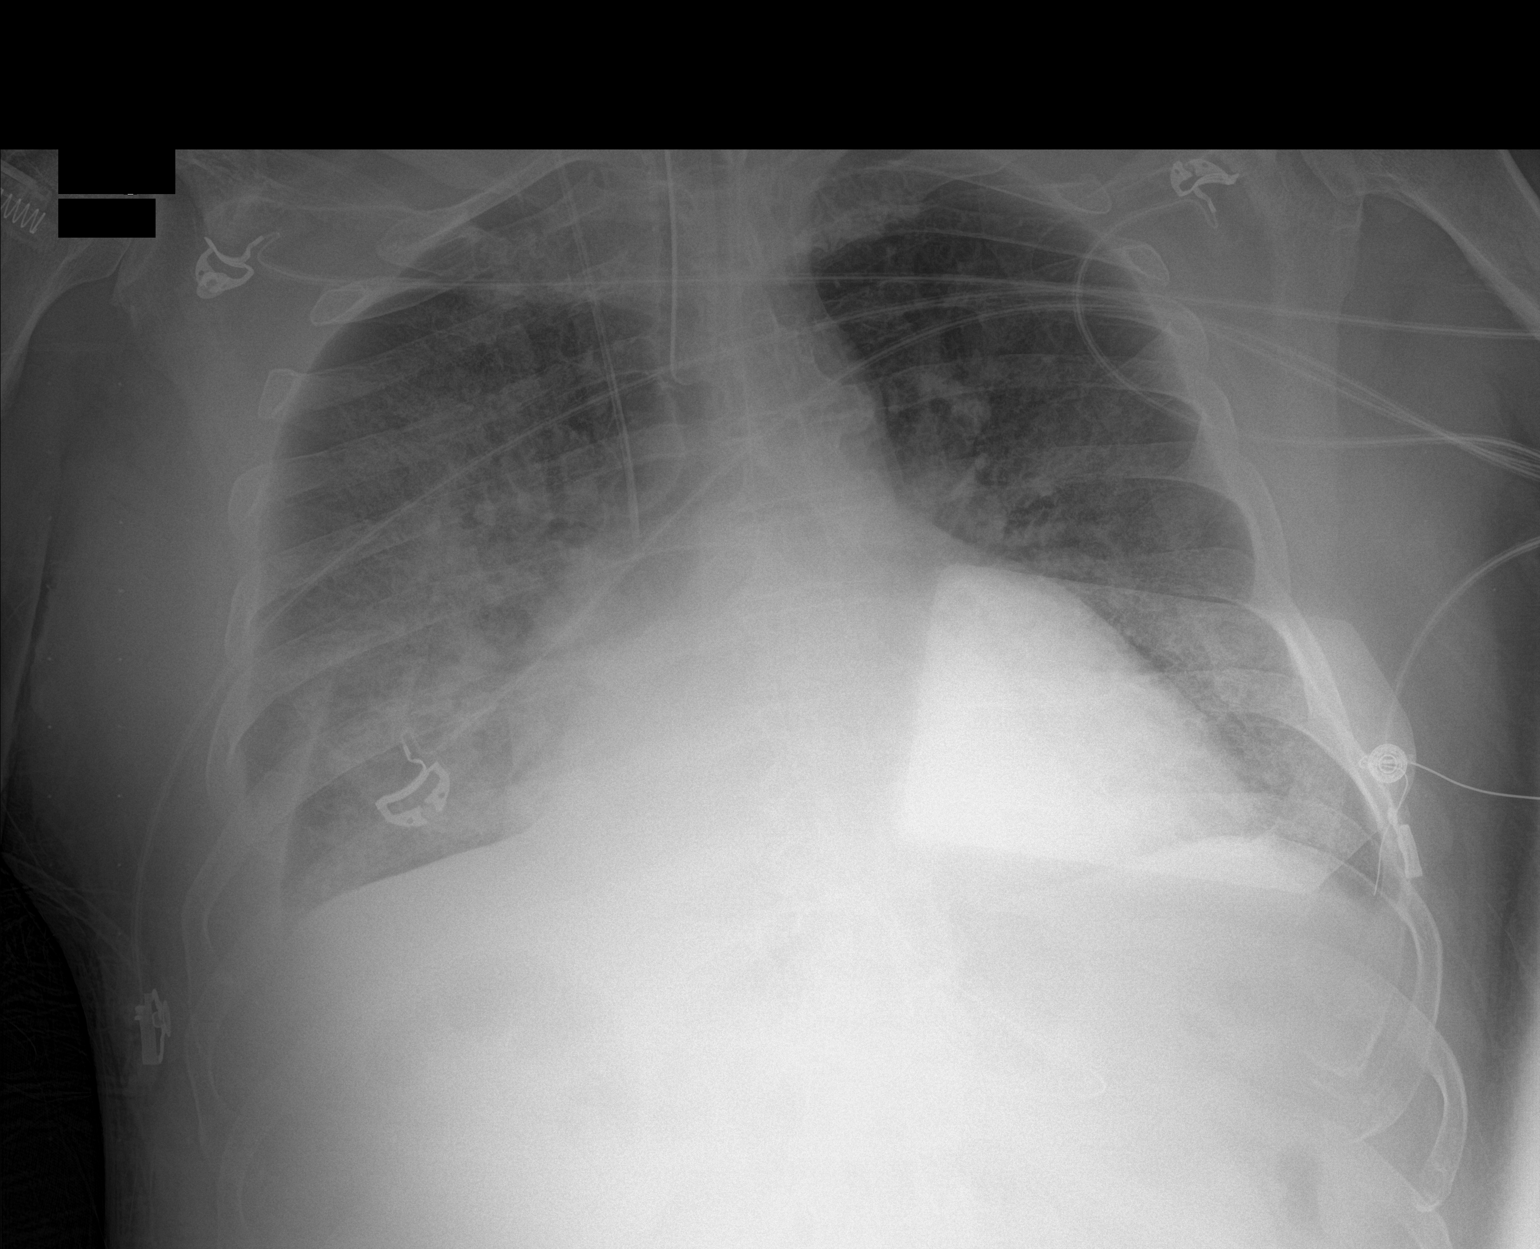

[1 of 1 positions shown; findings below may reference images not displayed]

FINDINGS: Endotracheal tube tip is 3.8 cm above the carina. Right internal
jugular central venous catheter terminates in the middle third of
the superior vena cava. Pacer pad overlies the left lower chest.
Enteric tube terminates in the stomach. Stable cardiomediastinal
silhouette with mild cardiomegaly. No pneumothorax. Stable small
bilateral pleural effusions, right greater the left. Moderate
pulmonary edema, improved. Bilateral lower lobe atelectasis, stable.
IMPRESSION: 1. Well-positioned support structures as detailed.
2. Moderate congestive heart failure, improved.
3. Stable small bilateral pleural effusions and bibasilar
atelectasis.

## 2018-06-02 NOTE — Progress Notes (Signed)
Subjective:  Patient states to be doing okay still has some mild palpitations and tachycardia denies any chest pain states to be feeling somewhat better  Objective:  Vital Signs in the last 24 hours:    Intake/Output from previous day: No intake/output data recorded. Intake/Output from this shift: No intake/output data recorded.  Physical Exam: General appearance: appears stated age Neck: no adenopathy, no carotid bruit, no JVD, supple, symmetrical, trachea midline and thyroid not enlarged, symmetric, no tenderness/mass/nodules Lungs: clear to auscultation bilaterally Heart: irregularly irregular rhythm Abdomen: soft, non-tender; bowel sounds normal; no masses,  no organomegaly Extremities: extremities normal, atraumatic, no cyanosis or edema Pulses: 2+ and symmetric Skin: Skin color, texture, turgor normal. No rashes or lesions Neurologic: Alert and oriented X 3, normal strength and tone. Normal symmetric reflexes. Normal coordination and gait  Lab Results: No results for input(s): WBC, HGB, PLT in the last 72 hours. No results for input(s): NA, K, CL, CO2, GLUCOSE, BUN, CREATININE in the last 72 hours. No results for input(s): TROPONINI in the last 72 hours.  Invalid input(s): CK, MB Hepatic Function Panel No results for input(s): PROT, ALBUMIN, AST, ALT, ALKPHOS, BILITOT, BILIDIR, IBILI in the last 72 hours. No results for input(s): CHOL in the last 72 hours. No results for input(s): PROTIME in the last 72 hours.  Imaging: Imaging results have been reviewed  Cardiac Studies:  Assessment/Plan:  Atrial Fibrillation Coronary Artery Disease Palpitations  Congestive heart failure Pulmonary embolus Pleural effusion Abnormal liver test Tobacco abuse Alcohol abuse . Continue current therapy with anticoagulation for A. fib and pulmonary embolus Diuretic therapy for heart failure Continue to correct electrolytes hyponatremia Echocardiogram will be helpful for further  cardiac assessment Advised patient to refrain from alcohol as well as tobacco abuse Recommend GI evaluation for elevated liver enzymes suggestive of hepatitis Continue treatment for alcohol withdrawal Do not recommend invasive strategy for cardiac work-up at this point  LOS: 3 days     D  06/02/2018, 11:46 AM
